# Patient Record
Sex: Female | Born: 1975 | Race: White | Hispanic: No | Marital: Single | Smoking: Current every day smoker
Health system: Southern US, Community
[De-identification: ages and names within clinical notes are randomized; demographics above are authoritative.]

## PROBLEM LIST (undated history)

## (undated) DIAGNOSIS — N809 Endometriosis, unspecified: Secondary | ICD-10-CM

## (undated) DIAGNOSIS — R0600 Dyspnea, unspecified: Secondary | ICD-10-CM

## (undated) DIAGNOSIS — J45909 Unspecified asthma, uncomplicated: Secondary | ICD-10-CM

## (undated) DIAGNOSIS — R569 Unspecified convulsions: Secondary | ICD-10-CM

## (undated) DIAGNOSIS — F419 Anxiety disorder, unspecified: Secondary | ICD-10-CM

## (undated) DIAGNOSIS — F32A Depression, unspecified: Secondary | ICD-10-CM

## (undated) DIAGNOSIS — Z8489 Family history of other specified conditions: Secondary | ICD-10-CM

## (undated) DIAGNOSIS — F329 Major depressive disorder, single episode, unspecified: Secondary | ICD-10-CM

## (undated) HISTORY — PX: TUBAL LIGATION: SHX77

## (undated) HISTORY — DX: Endometriosis, unspecified: N80.9

---

## 2014-10-25 DIAGNOSIS — R569 Unspecified convulsions: Secondary | ICD-10-CM

## 2014-10-25 HISTORY — DX: Unspecified convulsions: R56.9

## 2017-03-23 ENCOUNTER — Emergency Department: Payer: Medicaid Other

## 2017-03-23 ENCOUNTER — Emergency Department
Admission: EM | Admit: 2017-03-23 | Discharge: 2017-03-24 | Disposition: A | Discharge status: Home / Self Care | Payer: Medicaid Other | Attending: Emergency Medicine | Admitting: Emergency Medicine

## 2017-03-23 DIAGNOSIS — J45909 Unspecified asthma, uncomplicated: Secondary | ICD-10-CM | POA: Diagnosis not present

## 2017-03-23 DIAGNOSIS — N6452 Nipple discharge: Secondary | ICD-10-CM | POA: Insufficient documentation

## 2017-03-23 DIAGNOSIS — F17200 Nicotine dependence, unspecified, uncomplicated: Secondary | ICD-10-CM | POA: Diagnosis not present

## 2017-03-23 HISTORY — DX: Unspecified asthma, uncomplicated: J45.909

## 2017-03-23 LAB — COMPREHENSIVE METABOLIC PANEL
ALT: 15 U/L (ref 14–54)
AST: 17 U/L (ref 15–41)
Albumin: 4.3 g/dL (ref 3.5–5.0)
Alkaline Phosphatase: 53 U/L (ref 38–126)
Anion gap: 7 (ref 5–15)
BUN: 9 mg/dL (ref 6–20)
CO2: 25 mmol/L (ref 22–32)
Calcium: 8.8 mg/dL — ABNORMAL LOW (ref 8.9–10.3)
Chloride: 107 mmol/L (ref 101–111)
Creatinine, Ser: 0.75 mg/dL (ref 0.44–1.00)
GFR calc Af Amer: >60 mL/min (ref >60)
GFR calc non Af Amer: >60 mL/min (ref >60)
Glucose, Bld: 88 mg/dL (ref 65–99)
Potassium: 3.5 mmol/L (ref 3.5–5.1)
Sodium: 139 mmol/L (ref 135–145)
Total Bilirubin: 0.5 mg/dL (ref 0.3–1.2)
Total Protein: 7.6 g/dL (ref 6.5–8.1)

## 2017-03-23 LAB — CBC WITH DIFFERENTIAL/PLATELET
Basophils Absolute: 0 10*3/uL (ref 0–0.1)
Basophils Relative: 0 %
Eosinophils Absolute: 0.3 10*3/uL (ref 0–0.7)
Eosinophils Relative: 3 %
HCT: 41 % (ref 35.0–47.0)
Hemoglobin: 14.2 g/dL (ref 12.0–16.0)
Lymphocytes Relative: 32 %
Lymphs Abs: 3.7 10*3/uL — ABNORMAL HIGH (ref 1.0–3.6)
MCH: 33.4 pg (ref 26.0–34.0)
MCHC: 34.6 g/dL (ref 32.0–36.0)
MCV: 96.6 fL (ref 80.0–100.0)
Monocytes Absolute: 0.6 10*3/uL (ref 0.2–0.9)
Monocytes Relative: 5 %
Neutro Abs: 7.2 10*3/uL — ABNORMAL HIGH (ref 1.4–6.5)
Neutrophils Relative %: 60 %
Platelets: 239 10*3/uL (ref 150–440)
RBC: 4.25 MIL/uL (ref 3.80–5.20)
RDW: 13.4 % (ref 11.5–14.5)
WBC: 11.9 10*3/uL — ABNORMAL HIGH (ref 3.6–11.0)

## 2017-03-23 NOTE — ED Notes (Signed)
Pt states she has white discharge coming out of left nipple, states it feels like she is being stabbed with a needle. States it happened a few months ago and resolved.

## 2017-03-23 NOTE — ED Triage Notes (Signed)
White discharge from left nipple. Pt hx of this X 2 months ago but it cleared on its own.

## 2017-03-24 ENCOUNTER — Emergency Department: Payer: Medicaid Other

## 2017-03-24 ENCOUNTER — Encounter: Payer: Self-pay | Admitting: Emergency Medicine

## 2017-03-24 LAB — POCT PREGNANCY, URINE: PREG TEST UR: NEGATIVE

## 2017-03-24 MED ORDER — GADOBENATE DIMEGLUMINE 529 MG/ML IV SOLN
15.0000 mL | Freq: Once | INTRAVENOUS | Status: AC | PRN
  Administered 2017-03-24: 15 mL via INTRAVENOUS

## 2017-03-24 MED ORDER — NICOTINE 21 MG/24HR TD PT24
21.0000 mg | MEDICATED_PATCH | Freq: Once | TRANSDERMAL | Status: DC
  Administered 2017-03-24: 21 mg via TRANSDERMAL
  Filled 2017-03-24: qty 1

## 2017-03-24 MED ORDER — ALBUTEROL SULFATE HFA 108 (90 BASE) MCG/ACT IN AERS: 2.0000 | INHALATION_SPRAY | RESPIRATORY_TRACT | 0 refills | Status: DC | PRN

## 2017-03-24 NOTE — ED Notes (Signed)
Report from rebecca, rn.  

## 2017-03-24 NOTE — ED Provider Notes (Signed)
[facility] Emergency Department Provider Note  ____________________________________________  Time seen: Approximately 4:32 PM  I have reviewed the triage vital signs and the nursing notes.   HISTORY  Chief Complaint Breast Discharge    HPI Melinda Olsen is a 41 y.o. female presenting to the emergency department with white, thick left nipple discharge that has occurred intermittently for the past 2 months. Patient has also had left nipple pain. Patient has noticed no erythema, palpable masses or dimpling of the skin of the left breast. She has been afebrile. No fever or chills. No personal history of breast abscesses. No nipple rings. Patient takes no medications chronically. Patient denies possibility of pregnancy. Patient denies personal history of malignancy. No bony pain. No weight loss. No family history of breast cancer. She denies associated changes in vision, nausea, vomiting or abdominal pain. Patient has not sought care with primary care before to address chief complaint.  Patient is a daily smoker. No alleviating measures have been attempted.   Past Medical History:  Diagnosis Date  . Asthma     There are no active problems to display for this patient.   History reviewed. No pertinent surgical history.  Prior to Admission medications   Medication Sig Start Date End Date Taking? Authorizing Provider  albuterol (PROVENTIL HFA;VENTOLIN HFA) 108 (90 Base) MCG/ACT inhaler Inhale 2 puffs into the lungs every 4 (four) hours as needed for wheezing or shortness of breath. 03/24/17   Irean HongSung, Jade J, MD    Allergies Patient has no known allergies.  No family history on file.  Social History Social History  Substance Use Topics  . Smoking status: Current Every Day Smoker  . Smokeless tobacco: Not on file  . Alcohol use Yes     Review of Systems  Constitutional: No fever/chills Eyes: No visual changes. No discharge ENT: No upper respiratory  complaints. Cardiovascular: no chest pain. Respiratory: no cough. No SOB. Gastrointestinal: No abdominal pain.  No nausea, no vomiting.  No diarrhea.  No constipation. Genitourinary: Negative for dysuria. No hematuria Musculoskeletal: Negative for musculoskeletal pain. Skin: Patient has left nipple discharge Neurological: Negative for headaches, focal weakness or numbness.   ____________________________________________   PHYSICAL EXAM:  VITAL SIGNS: ED Triage Vitals  Enc Vitals Group     BP 03/23/17 2101 135/88     Pulse Rate 03/23/17 2101 (!) 106     Resp 03/23/17 2101 20     Temp 03/23/17 2101 99.3 F (37.4 C)     Temp Source 03/23/17 2101 Oral     SpO2 03/23/17 2101 99 %     Weight 03/23/17 2102 145 lb (65.8 kg)     Height 03/23/17 2102 5\' 7"  (1.702 m)     Head Circumference --      Peak Flow --      Pain Score 03/23/17 2101 9     Pain Loc --      Pain Edu? --      Excl. in GC? --      Constitutional: Alert and oriented. Well appearing and in no acute distress. Eyes: Conjunctivae are normal. PERRL. EOMI. Head: Atraumatic. Cardiovascular: Normal rate, regular rhythm. Normal S1 and S2.  Good peripheral circulation. Respiratory: Normal respiratory effort without tachypnea or retractions. Lungs CTAB. Good air entry to the bases with no decreased or absent breath sounds. Gastrointestinal: Bowel sounds 4 quadrants. Soft and nontender to palpation. No guarding or rigidity. No palpable masses. No distention. No CVA tenderness. Musculoskeletal: Full range  of motion to all extremities. No gross deformities appreciated. Neurologic:  Normal speech and language. No gross focal neurologic deficits are appreciated. Cranial nerves: 2-10 normal as tested. Cerebellar: Finger-nose-finger WNL, heel to shin WNL. Sensorimotor: No sensory loss or abnormal reflexes. Vision: No visual field deficts noted to confrontation.  Speech: No dysarthria or expressive aphasia.  Skin: Breast exam:  Left breast: No palpable masses. No dimpling of the skin. Patient is able to self express thick left nipple discharge.  Psychiatric: Mood and affect are normal. Speech and behavior are normal. Patient exhibits appropriate insight and judgement.   ____________________________________________   LABS (all labs ordered are listed, but only abnormal results are displayed)  Labs Reviewed  CBC WITH DIFFERENTIAL/PLATELET - Abnormal; Notable for the following:       Result Value   WBC 11.9 (*)    Neutro Abs 7.2 (*)    Lymphs Abs 3.7 (*)    All other components within normal limits  COMPREHENSIVE METABOLIC PANEL - Abnormal; Notable for the following:    Calcium 8.8 (*)    All other components within normal limits  PROLACTIN  POC URINE PREG, ED  POCT PREGNANCY, URINE   ____________________________________________  EKG   ____________________________________________  RADIOLOGYI, Orvil Feil, personally viewed and evaluated these images (plain radiographs) as part of my medical decision making, as well as reviewing the written report by the radiologist.  Mr Laqueta Jean Wo Contrast  Result Date: 03/24/2017 CLINICAL DATA:  Nipple discharge for 2 months. EXAM: MRI HEAD WITHOUT AND WITH CONTRAST TECHNIQUE: Multiplanar, multiecho pulse sequences of the brain and surrounding structures were obtained without and with intravenous contrast. CONTRAST:  15mL MULTIHANCE GADOBENATE DIMEGLUMINE 529 MG/ML IV SOLN COMPARISON:  None. FINDINGS: SELLA: No abnormal sellar expansion. Normal posterior pituitary bright spot. No early foci of suspicious hypo enhancement. Pituitary stalk is midline with normal enhancement. Symmetric appearance of the cavernous sinuses and cavernous sinus flow voids. Normal appearance of the overlying optic chiasm. INTRACRANIAL CONTENTS: No reduced diffusion to suggest acute ischemia or hypercellular tumor. The ventricles and sulci are normal for patient's age. No suspicious parenchymal  signal, mass lesions, mass effect. No abnormal intraparenchymal or extra-axial enhancement. No abnormal extra-axial fluid collections. VASCULAR: Normal major intracranial vascular flow voids present at skull base. ORBITS: The ocular globes and orbital contents are non-suspicious. SINUSES: Small LEFT maxillary mucosal retention cysts, trace paranasal sinus mucosal thickening. Mastoid air cells are well aerated. SKULL/SOFT TISSUES: No suspicious calvarial bone marrow signal. Craniocervical junction maintained. IMPRESSION: Normal MRI of the brain and sella with and without contrast. Acute findings discussed with and reconfirmed by Dr.JADE SUNG on 03/24/2017 at 3:36 am. Electronically Signed   By: Awilda Metro M.D.   On: 03/24/2017 03:37   US Breast Ltd Uni Left Inc Axilla  Result Date: 03/24/2017 CLINICAL DATA:  Discharge from the left nipple, acute onset. Initial encounter. EXAM: ULTRASOUND OF THE LEFT BREAST COMPARISON:  None. FINDINGS: On physical exam, no focal abnormality is seen. Targeted ultrasound is performed, showing no focal abnormality underlying the left nipple. There is no evidence of abscess or mass. IMPRESSION: Unremarkable limited left breast ultrasound. Left areola is unremarkable in appearance. No evidence of abscess or mass. BI-RADS CATEGORY  1 Electronically Signed   By: Roanna Raider M.D.   On: 03/24/2017 00:59    ____________________________________________    PROCEDURES  Procedure(s) performed:    Procedures    Medications  gadobenate dimeglumine (MULTIHANCE) injection 15 mL (15 mLs Intravenous Contrast Given 03/24/17  1610)     ____________________________________________   INITIAL IMPRESSION / ASSESSMENT AND PLAN / ED COURSE  Pertinent labs & imaging results that were available during my care of the patient were reviewed by me and considered in my medical decision making (see chart for details).  Review of the [place] CSRS was performed in accordance of the [facility]  prior to dispensing any controlled drugs.     Assessment and plan: Left Nipple Discharge  Patient presents the emergency department with left nipple discharge. On physical exam, there was no dimpling of the skin overlying the left breast. No erythema or palpable masses. Patient was able to self express white nipple discharge that was purulent in nature. There was concern for left breast abscess. I discussed patient's case with Dr. Corliss Parish and we agreed that left breast ultrasound was warranted. Patient's care was transitioned to Dr. Chiquita Loth as results were being awaited from left breast ultrasound. If results of left breast ultrasound were noncontributory for diagnosis, an MRI of the brain was planned. Vital signs were reassuring prior to transitioned to main side. Dr. Dolores Frame assumed care. All patient questions were answered.    ____________________________________________  FINAL CLINICAL IMPRESSION(S) / ED DIAGNOSES  Final diagnoses:  Discharge from left nipple  Nipple discharge in female      NEW MEDICATIONS STARTED DURING THIS VISIT:  There are no discharge medications for this patient.       This chart was dictated using voice recognition software/Dragon. Despite best efforts to proofread, errors can occur which can change the meaning. Any change was purely unintentional.    Gasper Lloyd 03/24/17 1645    Loleta Rose, MD 03/24/17 Bettye Boeck    Loleta Rose, MD 03/24/17 [phone]

## 2017-03-24 NOTE — ED Provider Notes (Signed)
-----------------------------------------   1:57 AM on 03/24/2017 -----------------------------------------  Updated patient of ultrasound results and plan for MRI of the brain to evaluate for pituitary tumor. Patient wanting to go outside to smoke a cigarette. Nicotine patch offered.  ----------------------------------------- 3:57 AM on 03/24/2017 -----------------------------------------  MRI brain discussed with Dr. Karie KirksBloomer: Normal MRI of the brain and sella with and without contrast.  Updated patient of negative MRI results. She has received quite a thorough workup in the emergency department with basic lab work, prolactin (which is a send out test), ultrasound of the breast as well as MRI of the brain to rule out pituitary tumor. 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 Strict return precautions given. Patient verbalizes understanding and agrees with plan of care. Requests refill on albuterol prescription.   Irean HongSung, Jade J, MD 03/24/17 [id number]

## 2017-03-24 NOTE — ED Notes (Signed)
Patient transported to MRI 

## 2017-03-24 NOTE — Discharge Instructions (Signed)
Return to the ER for worsening symptoms, fever, redness or swelling or other concerns.

## 2017-03-24 NOTE — ED Notes (Signed)
Pt has removed jewelry and placed in cup.

## 2017-03-24 NOTE — ED Notes (Signed)
Dr. Dolores FrameSung in to updated pt and discuss plan of care. Pt states she would like to go out and smoke a cigarette. Nicotine patch offered by md. Pt  Consents to nicotine patch.  Po fluids offered with md consent.

## 2017-03-25 LAB — PROLACTIN: Prolactin: 5.2 ng/mL (ref 4.8–23.3)

## 2017-03-26 ENCOUNTER — Emergency Department
Admission: EM | Admit: 2017-03-26 | Discharge: 2017-03-26 | Disposition: A | Discharge status: Home / Self Care | Payer: Medicaid Other | Attending: Emergency Medicine | Admitting: Emergency Medicine

## 2017-03-26 ENCOUNTER — Encounter: Payer: Self-pay | Admitting: Emergency Medicine

## 2017-03-26 DIAGNOSIS — F17200 Nicotine dependence, unspecified, uncomplicated: Secondary | ICD-10-CM | POA: Insufficient documentation

## 2017-03-26 DIAGNOSIS — N6452 Nipple discharge: Secondary | ICD-10-CM | POA: Diagnosis not present

## 2017-03-26 DIAGNOSIS — J45909 Unspecified asthma, uncomplicated: Secondary | ICD-10-CM | POA: Diagnosis not present

## 2017-03-26 DIAGNOSIS — Z79899 Other long term (current) drug therapy: Secondary | ICD-10-CM | POA: Insufficient documentation

## 2017-03-26 LAB — CBC WITH DIFFERENTIAL/PLATELET
BASOS ABS: 0 10*3/uL (ref 0–0.1)
BASOS PCT: 0 %
EOS ABS: 0.2 10*3/uL (ref 0–0.7)
EOS PCT: 3 %
HCT: 39.5 % (ref 35.0–47.0)
HEMOGLOBIN: 13.5 g/dL (ref 12.0–16.0)
Lymphocytes Relative: 29 %
Lymphs Abs: 2.6 10*3/uL (ref 1.0–3.6)
MCH: 33.1 pg (ref 26.0–34.0)
MCHC: 34.2 g/dL (ref 32.0–36.0)
MCV: 96.9 fL (ref 80.0–100.0)
Monocytes Absolute: 0.6 10*3/uL (ref 0.2–0.9)
Monocytes Relative: 7 %
NEUTROS PCT: 61 %
Neutro Abs: 5.5 10*3/uL (ref 1.4–6.5)
PLATELETS: 248 10*3/uL (ref 150–440)
RBC: 4.07 MIL/uL (ref 3.80–5.20)
RDW: 13.4 % (ref 11.5–14.5)
WBC: 8.9 10*3/uL (ref 3.6–11.0)

## 2017-03-26 LAB — COMPREHENSIVE METABOLIC PANEL
ALK PHOS: 57 U/L (ref 38–126)
ALT: 15 U/L (ref 14–54)
AST: 18 U/L (ref 15–41)
Albumin: 4.2 g/dL (ref 3.5–5.0)
Anion gap: 8 (ref 5–15)
BUN: 14 mg/dL (ref 6–20)
CALCIUM: 8.9 mg/dL (ref 8.9–10.3)
CHLORIDE: 104 mmol/L (ref 101–111)
CO2: 26 mmol/L (ref 22–32)
CREATININE: 0.8 mg/dL (ref 0.44–1.00)
GFR calc non Af Amer: >60 mL/min (ref >60)
Glucose, Bld: 78 mg/dL (ref 65–99)
Potassium: 3.9 mmol/L (ref 3.5–5.1)
Sodium: 138 mmol/L (ref 135–145)
Total Bilirubin: 0.4 mg/dL (ref 0.3–1.2)
Total Protein: 7.5 g/dL (ref 6.5–8.1)

## 2017-03-26 MED ORDER — SULFAMETHOXAZOLE-TRIMETHOPRIM 800-160 MG PO TABS
1.0000 | ORAL_TABLET | Freq: Once | ORAL | Status: AC
  Administered 2017-03-26: 1 via ORAL
  Filled 2017-03-26: qty 1

## 2017-03-26 MED ORDER — METRONIDAZOLE 500 MG PO TABS: 500.0000 mg | ORAL_TABLET | Freq: Two times a day (BID) | ORAL | 0 refills | Status: DC

## 2017-03-26 MED ORDER — SULFAMETHOXAZOLE-TRIMETHOPRIM 800-160 MG PO TABS: 1.0000 | ORAL_TABLET | Freq: Two times a day (BID) | ORAL | 0 refills | Status: DC

## 2017-03-26 MED ORDER — METRONIDAZOLE 500 MG PO TABS
500.0000 mg | ORAL_TABLET | Freq: Once | ORAL | Status: AC
  Administered 2017-03-26: 500 mg via ORAL
  Filled 2017-03-26: qty 1

## 2017-03-26 NOTE — ED Notes (Addendum)
PT states seen here 5/30 with Lt breast d/c-US, labs, MRI reassuring. Pt states unable to get into Endocrinology as referred until Monday. Pt states this morning d/c from breast was bright blood smeared puss and decided to be seen emergently again d/t d/c paperwork general warnings.    Denies fever, or any other sx at this time.

## 2017-03-26 NOTE — ED Provider Notes (Signed)
[facility] Emergency Department Provider Note ____________________________________________   I have reviewed the triage vital signs and the triage nursing note.  HISTORY  Chief Complaint Breast Discharge   Historian Patient  HPI Melinda Olsen is a 41 y.o. female presents for reevaluation for left nipple drainage from her left breast. She states symptoms started on last Saturday, and she came to the emergency department on Thursday evening. I reviewed the chart history and ultrasound of the left breast was negative, and MRI the brain was reassuring.  Patient states that she is having increased amount of discharge that seems more puslike and is also slightly bloody. The nipple is also painful and feels sharp. No fever or nausea or sweats.  Pain is moderate.   Past Medical History:  Diagnosis Date  . Asthma     There are no active problems to display for this patient.   Past Surgical History:  Procedure Laterality Date  . CESAREAN SECTION      Prior to Admission medications   Medication Sig Start Date End Date Taking? Authorizing Provider  albuterol (PROVENTIL HFA;VENTOLIN HFA) 108 (90 Base) MCG/ACT inhaler Inhale 2 puffs into the lungs every 4 (four) hours as needed for wheezing or shortness of breath. 03/24/17  Yes Irean Hong, MD  ibuprofen (ADVIL,MOTRIN) 200 MG tablet Take 200 mg by mouth every 6 (six) hours as needed.   Yes [provider]  metroNIDAZOLE (FLAGYL) 500 MG tablet Take 1 tablet (500 mg total) by mouth 2 (two) times daily. 03/26/17   Governor Rooks, MD  sulfamethoxazole-trimethoprim (BACTRIM DS,SEPTRA DS) 800-160 MG tablet Take 1 tablet by mouth 2 (two) times daily. 03/26/17   Governor Rooks, MD    No Known Allergies  History reviewed. No pertinent family history.  Social History Social History  Substance Use Topics  . Smoking status: Current Every Day Smoker  . Smokeless tobacco: Never Used  . Alcohol use Yes     Review of Systems  Constitutional: Negative for fever. Eyes: Negative for visual changes. ENT: Negative for sore throat. Cardiovascular: Negative for chest pain. Respiratory: Negative for shortness of breath. Gastrointestinal: Negative for abdominal pain, vomiting and diarrhea. Genitourinary: Negative for dysuria. Musculoskeletal: Negative for back pain. Skin: Negative for rash. Neurological: Negative for headache.  ____________________________________________   PHYSICAL EXAM:  VITAL SIGNS: ED Triage Vitals  Enc Vitals Group     BP 03/26/17 1642 121/74     Pulse Rate 03/26/17 1642 98     Resp 03/26/17 1642 18     Temp 03/26/17 1642 98.3 F (36.8 C)     Temp Source 03/26/17 1642 Oral     SpO2 03/26/17 1642 98 %     Weight 03/26/17 1642 145 lb (65.8 kg)     Height 03/26/17 1642 5\' 7"  (1.702 m)     Head Circumference --      Peak Flow --      Pain Score 03/26/17 1641 9     Pain Loc --      Pain Edu? --      Excl. in GC? --      Constitutional: Alert and oriented. Well appearing and in no distress. HEENT   Head: Normocephalic and atraumatic.      Eyes: Conjunctivae are normal. Pupils equal and round.       Ears:         Nose: No congestion/rhinnorhea.   Mouth/Throat: Mucous membranes are moist.   Neck: No stridor. Cardiovascular/Chest: Left breast  normal visual inspection without redness, peau d'orange.  On palpation no lumps appreciated. No evidence of a skin cellulitis or fluctuance or induration. With fairly minimal press on the nipple small amount of purulent/bloody material comes out, about a drop at a time. She also reports that sore tender.  No axillary lymphadenopathy on the left. Respiratory: Normal respiratory effort without tachypnea nor retractions. Gastrointestinal: Nontender.  Genitourinary/rectal:Deferred Musculoskeletal: Normal appearance of the extremities. Neurologic:  Normal speech and language. No gross or focal neurologic deficits  are appreciated. Skin:  Skin is warm, dry and intact.   Psychiatric: Mood and affect are normal. Speech and behavior are normal. Patient exhibits appropriate insight and judgment.   ____________________________________________  LABS (pertinent positives/negatives)  Labs Reviewed  AEROBIC CULTURE (SUPERFICIAL SPECIMEN)  CBC WITH DIFFERENTIAL/PLATELET  COMPREHENSIVE METABOLIC PANEL    ____________________________________________    EKG I, Governor Rooksebecca Madissen Wyse, MD, the attending physician have personally viewed and interpreted all ECGs.  None ____________________________________________  RADIOLOGY All Xrays were viewed by me. Imaging interpreted by Radiologist.  None __________________________________________  PROCEDURES  Procedure(s) performed: None  Critical Care performed: None  ____________________________________________   ED COURSE / ASSESSMENT AND PLAN  Pertinent labs & imaging results that were available during my care of the patient were reviewed by me and considered in my medical decision making (see chart for details).    She is overall well-appearing and afebrile with stable vital signs. To visual inspection there is no obvious abscess or fluctuance or induration. She does not seem to be systemically ill. The material expressed from the nipple does look more purulent to me than it does milk. When she was seen on the 30th she did have a low-grade temperature and was a little tachycardic, today she's not. White blood cell count was elevated before and now it's not. In any case, I'm wondering at this point if there was some sort of small abscess that is now found its way draining through the nipple. And in a place her on Bactrim for MRSA coverage as well as Flagyl for anaerobic coverage given location subareolar area.    I don't see an indication for repeat imaging to this area at this point in time. She does not have a primary care doctor and I am referring her to  [facility]. I think she should probably be set up to have an mammogram and potentially a surgeon if she has any ongoing issues.  I am going to send a culture of the discharge from the nipple.    CONSULTATIONS:  None   Patient / Family / Caregiver informed of clinical course, medical decision-making process, and agree with plan.   I discussed return precautions, follow-up instructions, and discharge instructions with patient and/or family.  Discharge Instructions : You were evaluated for nipple drainage and as we discussed, I am suspicious of pus and am going to place you on 2 antibiotics to cover possible infections.  You do need close follow-up with a primary care doctor and her referred to the [facility]. As we discussed, he will likely need to be scheduled for a mammogram and possible referral to breast surgeon for further evaluation. Next the next line please go ahead and keep your endocrinology appointment.  Please return to the emergency room immediately for any worsening pain, breast swelling, skin redness, fever, dizziness, nausea, or any other symptoms concerning to you.  ___________________________________________   FINAL CLINICAL IMPRESSION(S) / ED DIAGNOSES   Final diagnoses:  Purulent discharge from nipple  Note: This dictation was prepared with Dragon dictation. Any transcriptional errors that result from this process are unintentional    Governor Rooks, MD 03/26/17 2028

## 2017-03-26 NOTE — Discharge Instructions (Signed)
You were evaluated for nipple drainage and as we discussed, I am suspicious of pus and am going to place you on 2 antibiotics to cover possible infections.  You do need close follow-up with a primary care doctor and her referred to the [facility]. As we discussed, he will likely need to be scheduled for a mammogram and possible referral to breast surgeon for further evaluation. Next the next line please go ahead and keep your endocrinology appointment.  Please return to the emergency room immediately for any worsening pain, breast swelling, skin redness, fever, dizziness, nausea, or any other symptoms concerning to you.

## 2017-03-26 NOTE — ED Notes (Signed)
Pt. Verbalizes understanding of d/c instructions, prescriptions, and follow-up. VS stable.Pt. In NAD at time of d/c and denies further concerns regarding this visit. Pt. Stable at the time of departure from the unit, departing unit by the safest and most appropriate manner per that pt condition and limitations. Pt advised to return to the ED at any time for emergent concerns, or for new/worsening symptoms.   

## 2017-03-26 NOTE — ED Triage Notes (Signed)
Pt states was seen here on 5/30 for white discharge from L nipple, states was told to return for worsening of the discharge, pt states has now turned into "pus and bloody drainage" from L nipple and c/o pain that feels like her "nipple is being pierced".

## 2017-03-29 LAB — AEROBIC CULTURE W GRAM STAIN (SUPERFICIAL SPECIMEN)

## 2017-03-29 LAB — AEROBIC CULTURE  (SUPERFICIAL SPECIMEN): CULTURE: NORMAL

## 2017-04-11 ENCOUNTER — Ambulatory Visit: Payer: Self-pay | Attending: Oncology

## 2017-09-26 ENCOUNTER — Encounter: Payer: Self-pay | Admitting: Emergency Medicine

## 2017-09-26 ENCOUNTER — Emergency Department
Admission: EM | Admit: 2017-09-26 | Discharge: 2017-09-26 | Disposition: A | Discharge status: Home / Self Care | Payer: Medicaid Other | Attending: Emergency Medicine | Admitting: Emergency Medicine

## 2017-09-26 ENCOUNTER — Other Ambulatory Visit: Payer: Self-pay

## 2017-09-26 DIAGNOSIS — R112 Nausea with vomiting, unspecified: Secondary | ICD-10-CM | POA: Insufficient documentation

## 2017-09-26 DIAGNOSIS — N6452 Nipple discharge: Secondary | ICD-10-CM | POA: Diagnosis not present

## 2017-09-26 DIAGNOSIS — J45909 Unspecified asthma, uncomplicated: Secondary | ICD-10-CM | POA: Diagnosis not present

## 2017-09-26 DIAGNOSIS — F17200 Nicotine dependence, unspecified, uncomplicated: Secondary | ICD-10-CM | POA: Insufficient documentation

## 2017-09-26 DIAGNOSIS — R1010 Upper abdominal pain, unspecified: Secondary | ICD-10-CM | POA: Diagnosis present

## 2017-09-26 DIAGNOSIS — R197 Diarrhea, unspecified: Secondary | ICD-10-CM

## 2017-09-26 LAB — COMPREHENSIVE METABOLIC PANEL
ALT: 21 U/L (ref 14–54)
ANION GAP: 8 (ref 5–15)
AST: 24 U/L (ref 15–41)
Albumin: 4.3 g/dL (ref 3.5–5.0)
Alkaline Phosphatase: 72 U/L (ref 38–126)
BUN: 8 mg/dL (ref 6–20)
CO2: 25 mmol/L (ref 22–32)
Calcium: 9.1 mg/dL (ref 8.9–10.3)
Chloride: 105 mmol/L (ref 101–111)
Creatinine, Ser: 0.82 mg/dL (ref 0.44–1.00)
GFR calc Af Amer: >60 mL/min (ref >60)
GFR calc non Af Amer: >60 mL/min (ref >60)
GLUCOSE: 97 mg/dL (ref 65–99)
POTASSIUM: 3.3 mmol/L — AB (ref 3.5–5.1)
SODIUM: 138 mmol/L (ref 135–145)
Total Bilirubin: 0.4 mg/dL (ref 0.3–1.2)
Total Protein: 8.1 g/dL (ref 6.5–8.1)

## 2017-09-26 LAB — CBC
HEMATOCRIT: 43.3 % (ref 35.0–47.0)
HEMOGLOBIN: 14.6 g/dL (ref 12.0–16.0)
MCH: 32.3 pg (ref 26.0–34.0)
MCHC: 33.7 g/dL (ref 32.0–36.0)
MCV: 95.9 fL (ref 80.0–100.0)
Platelets: 293 10*3/uL (ref 150–440)
RBC: 4.52 MIL/uL (ref 3.80–5.20)
RDW: 13.4 % (ref 11.5–14.5)
WBC: 13.7 10*3/uL — ABNORMAL HIGH (ref 3.6–11.0)

## 2017-09-26 LAB — URINALYSIS, COMPLETE (UACMP) WITH MICROSCOPIC
BACTERIA UA: NONE SEEN
Bilirubin Urine: NEGATIVE
Glucose, UA: NEGATIVE mg/dL
Hgb urine dipstick: NEGATIVE
Ketones, ur: 5 mg/dL — AB
Leukocytes, UA: NEGATIVE
Nitrite: NEGATIVE
PROTEIN: 30 mg/dL — AB
Specific Gravity, Urine: 1.021 (ref 1.005–1.030)
pH: 5 (ref 5.0–8.0)

## 2017-09-26 LAB — LIPASE, BLOOD: LIPASE: 32 U/L (ref 11–51)

## 2017-09-26 LAB — POCT PREGNANCY, URINE: PREG TEST UR: NEGATIVE

## 2017-09-26 MED ORDER — GI COCKTAIL ~~LOC~~
30.0000 mL | Freq: Once | ORAL | Status: AC
  Administered 2017-09-26: 30 mL via ORAL
  Filled 2017-09-26: qty 30

## 2017-09-26 MED ORDER — ONDANSETRON HCL 4 MG PO TABS
4.0000 mg | ORAL_TABLET | Freq: Once | ORAL | Status: AC
  Administered 2017-09-26: 4 mg via ORAL
  Filled 2017-09-26 (×2): qty 1

## 2017-09-26 MED ORDER — ONDANSETRON HCL 4 MG PO TABS: 4.0000 mg | ORAL_TABLET | Freq: Every day | ORAL | 0 refills | Status: DC | PRN

## 2017-09-26 MED ORDER — DICYCLOMINE HCL 10 MG PO CAPS: 10.0000 mg | ORAL_CAPSULE | Freq: Once | ORAL | Status: DC

## 2017-09-26 MED ORDER — DICYCLOMINE HCL 20 MG PO TABS: 20.0000 mg | ORAL_TABLET | Freq: Three times a day (TID) | ORAL | 0 refills | Status: DC | PRN

## 2017-09-26 NOTE — ED Provider Notes (Addendum)
[facility] Emergency Department Provider Note  ____________________________________________   First MD Initiated Contact with Patient 09/26/17 1952     (approximate)  I have reviewed the triage vital signs and the nursing notes.   HISTORY  Chief Complaint Abdominal Pain   HPI Melinda Olsen is a 41 y.o. female with a history of asthma as well as hemorrhoids who is presenting to the emergency department upper abdominal pain which she describes as a cramping that is nonradiating over the past 24 hours.  She has associated nausea and 12 episodes of diarrhea with blood streaks in them.  Denies any recent antibiotics.  Denies any recent hospitalizations.  Denies any fever.  Says that her pain has been intermittent to the upper abdomen and cramping in quality.  Only minimal pain at this time.  Patient also reports left nipple discharge which is intermittent and which she has an appointment established with her primary care doctor on 18 December.   Past Medical History:  Diagnosis Date  . Asthma     There are no active problems to display for this patient.   Past Surgical History:  Procedure Laterality Date  . CESAREAN SECTION      Prior to Admission medications   Medication Sig Start Date End Date Taking? Authorizing Provider  albuterol (PROVENTIL HFA;VENTOLIN HFA) 108 (90 Base) MCG/ACT inhaler Inhale 2 puffs into the lungs every 4 (four) hours as needed for wheezing or shortness of breath. 03/24/17   Irean HongSung, Jade J, MD  ibuprofen (ADVIL,MOTRIN) 200 MG tablet Take 200 mg by mouth every 6 (six) hours as needed.    [provider]  metroNIDAZOLE (FLAGYL) 500 MG tablet Take 1 tablet (500 mg total) by mouth 2 (two) times daily. 03/26/17   Governor RooksLord, Rebecca, MD  sulfamethoxazole-trimethoprim (BACTRIM DS,SEPTRA DS) 800-160 MG tablet Take 1 tablet by mouth 2 (two) times daily. 03/26/17   Governor RooksLord, Rebecca, MD    Allergies Patient has no known allergies.  No  family history on file.  Social History Social History   Tobacco Use  . Smoking status: Current Every Day Smoker  . Smokeless tobacco: Never Used  Substance Use Topics  . Alcohol use: Yes  . Drug use: No    Review of Systems  Constitutional: No fever/chills Eyes: No visual changes. ENT: No sore throat. Cardiovascular: Denies chest pain. Respiratory: Denies shortness of breath. Gastrointestinal:  No constipation. Genitourinary: Negative for dysuria. Musculoskeletal: Negative for back pain. Skin: Negative for rash. Neurological: Negative for headaches, focal weakness or numbness.   ____________________________________________   PHYSICAL EXAM:  VITAL SIGNS: ED Triage Vitals  Enc Vitals Group     BP 09/26/17 1917 115/86     Pulse Rate 09/26/17 1917 (!) 119     Resp 09/26/17 1917 17     Temp 09/26/17 1917 97.6 F (36.4 C)     Temp Source 09/26/17 1917 Oral     SpO2 09/26/17 1917 98 %     Weight 09/26/17 1919 173 lb (78.5 kg)     Height 09/26/17 1919 5\' 7"  (1.702 m)     Head Circumference --      Peak Flow --      Pain Score 09/26/17 1924 9     Pain Loc --      Pain Edu? --      Excl. in GC? --     Constitutional: Alert and oriented. Well appearing and in no acute distress. Eyes: Conjunctivae are normal.  Head: Atraumatic.  Nose: No congestion/rhinnorhea. Mouth/Throat: Mucous membranes are moist.  Neck: No stridor.   Cardiovascular: Normal rate, regular rhythm. Grossly normal heart sounds.  Breast:   Patient demonstrates a small amount of discharge from left nipple that is expressed by her.  I did not examine with any palpation.  There does not appear to be any erythema or any thickened skin or any obvious masses. Respiratory: Normal respiratory effort.  No retractions. Lungs CTAB. Gastrointestinal: Soft with minimal epigastric tenderness and left upper quadrant tenderness.  No tenderness to palpation over the gallbladder/right upper quadrant.  However, there is  tenderness palpation over the right lower anterior ribs without any ecchymosis, deformity or crepitus.  There is no overlying rash.  Negative Murphy sign.  No lower abdominal tenderness over McBurney's point or across the lower abdomen.. No distention. No CVA tenderness. Musculoskeletal: No lower extremity tenderness nor edema.  No joint effusions. Neurologic:  Normal speech and language. No gross focal neurologic deficits are appreciated. Skin:  Skin is warm, dry and intact. No rash noted. Psychiatric: Mood and affect are normal. Speech and behavior are normal.  ____________________________________________   LABS (all labs ordered are listed, but only abnormal results are displayed)  Labs Reviewed  COMPREHENSIVE METABOLIC PANEL - Abnormal; Notable for the following components:      Result Value   Potassium 3.3 (*)    All other components within normal limits  CBC - Abnormal; Notable for the following components:   WBC 13.7 (*)    All other components within normal limits  URINALYSIS, COMPLETE (UACMP) WITH MICROSCOPIC - Abnormal; Notable for the following components:   Color, Urine YELLOW (*)    APPearance HAZY (*)    Ketones, ur 5 (*)    Protein, ur 30 (*)    Squamous Epithelial / LPF 6-30 (*)    All other components within normal limits  LIPASE, BLOOD  POCT PREGNANCY, URINE  POC URINE PREG, ED   ____________________________________________  EKG   ____________________________________________  RADIOLOGY   ____________________________________________   PROCEDURES  Procedure(s) performed:   Procedures  Critical Care performed:   ____________________________________________   INITIAL IMPRESSION / ASSESSMENT AND PLAN / ED COURSE  Pertinent labs & imaging results that were available during my care of the patient were reviewed by me and considered in my medical decision making (see chart for details).  Differential diagnosis includes, but is not limited to,  biliary disease (biliary colic, acute cholecystitis, cholangitis, choledocholithiasis, etc), intrathoracic causes for epigastric abdominal pain including ACS, gastritis, duodenitis, pancreatitis, small bowel or large bowel obstruction, abdominal aortic aneurysm, hernia, and gastritis.  As part of my medical decision making, I reviewed the following data within the electronic MEDICAL RECORD NUMBER Notes from prior ED visits  Patient with elevated white blood cell count but no lower abdominal pain and only minimal tenderness to the upper abdomen.  Minimal risk factors for C. difficile or other serious forms of diarrhea.  Patient will receive GI cocktail as well as Zofran.  Patient to be reassessed.    ----------------------------------------- 9:51 PM on 09/26/2017 -----------------------------------------  Patient at this time his pain is still present but she has not had any diarrhea while in the emergency department.  No vomiting and able to tolerate GI cocktail and small sips of water.  Still with tenderness over the rib without any respiratory distress.  Patient saying at this time that she "just wants to go home."  I offered further testing such as imaging which she is unwilling at  this time.  She says that she will return for any worsening or concerning symptoms and will follow-up at her note appointment on 18 December.  Patient's abdomen was also re-auscultated.  Diffusely nontender.  Patient also states that she has blood on her toilet tissue even when she is not moving her bowels.  Unlikely to be colitis.  ____________________________________________   FINAL CLINICAL IMPRESSION(S) / ED DIAGNOSES  Left-sided nipple discharge.  Nausea vomiting and diarrhea.    NEW MEDICATIONS STARTED DURING THIS VISIT:  This SmartLink is deprecated. Use AVSMEDLIST instead to display the medication list for a patient.   Note:  This document was prepared using Dragon voice recognition software and may  include unintentional dictation errors.     Myrna Blazer, MD 09/26/17 2152    Myrna Blazer, MD 09/26/17 2153

## 2017-09-26 NOTE — ED Triage Notes (Signed)
Patient to ER for left abd pain and epigastric pain. Also reports h/o hemorrhoids, has had blood streaked on stools last couple of days as well. Denies vomiting. +Nausea, approx 10-11 episodes of diarrhea.

## 2017-11-06 ENCOUNTER — Other Ambulatory Visit: Payer: Self-pay

## 2017-11-06 ENCOUNTER — Emergency Department
Admission: EM | Admit: 2017-11-06 | Discharge: 2017-11-06 | Disposition: A | Discharge status: Home / Self Care | Payer: Medicaid Other | Attending: Emergency Medicine | Admitting: Emergency Medicine

## 2017-11-06 DIAGNOSIS — N946 Dysmenorrhea, unspecified: Secondary | ICD-10-CM | POA: Insufficient documentation

## 2017-11-06 DIAGNOSIS — Z79899 Other long term (current) drug therapy: Secondary | ICD-10-CM | POA: Diagnosis not present

## 2017-11-06 DIAGNOSIS — J45909 Unspecified asthma, uncomplicated: Secondary | ICD-10-CM | POA: Insufficient documentation

## 2017-11-06 DIAGNOSIS — F17200 Nicotine dependence, unspecified, uncomplicated: Secondary | ICD-10-CM | POA: Diagnosis not present

## 2017-11-06 DIAGNOSIS — R102 Pelvic and perineal pain: Secondary | ICD-10-CM | POA: Diagnosis present

## 2017-11-06 LAB — CBC
HEMATOCRIT: 38.8 % (ref 35.0–47.0)
Hemoglobin: 13.1 g/dL (ref 12.0–16.0)
MCH: 32 pg (ref 26.0–34.0)
MCHC: 33.9 g/dL (ref 32.0–36.0)
MCV: 94.6 fL (ref 80.0–100.0)
Platelets: 300 10*3/uL (ref 150–440)
RBC: 4.1 MIL/uL (ref 3.80–5.20)
RDW: 13.3 % (ref 11.5–14.5)
WBC: 9.3 10*3/uL (ref 3.6–11.0)

## 2017-11-06 LAB — COMPREHENSIVE METABOLIC PANEL
ALBUMIN: 3.8 g/dL (ref 3.5–5.0)
ALT: 17 U/L (ref 14–54)
AST: 21 U/L (ref 15–41)
Alkaline Phosphatase: 66 U/L (ref 38–126)
Anion gap: 7 (ref 5–15)
BILIRUBIN TOTAL: 0.5 mg/dL (ref 0.3–1.2)
BUN: 8 mg/dL (ref 6–20)
CHLORIDE: 108 mmol/L (ref 101–111)
CO2: 23 mmol/L (ref 22–32)
Calcium: 8 mg/dL — ABNORMAL LOW (ref 8.9–10.3)
Creatinine, Ser: 0.7 mg/dL (ref 0.44–1.00)
GFR calc Af Amer: >60 mL/min (ref >60)
GFR calc non Af Amer: >60 mL/min (ref >60)
GLUCOSE: 94 mg/dL (ref 65–99)
POTASSIUM: 3.9 mmol/L (ref 3.5–5.1)
SODIUM: 138 mmol/L (ref 135–145)
TOTAL PROTEIN: 6.9 g/dL (ref 6.5–8.1)

## 2017-11-06 LAB — LIPASE, BLOOD: Lipase: 34 U/L (ref 11–51)

## 2017-11-06 NOTE — ED Notes (Signed)
Pt states that she "feels like my ovaries are going to fall out" pt states that she has been in  for 4 years and hasn't set up for a dr, pt states that she now has an appt for the 29th for a family care dr, pt states that approx  5 years ago [place] in [place] tried to diagnose her with endometriosis. Pt reports that she has pain regardless of her position

## 2017-11-06 NOTE — ED Provider Notes (Signed)
[facility] Emergency Department Provider Note  ____________________________________________  Time seen: Approximately 11:05 PM  I have reviewed the triage vital signs and the nursing notes.   HISTORY  Chief Complaint Abdominal Pain and Back Pain    HPI Melinda Olsen is a 42 y.o. female who complains of pelvic pain radiating to her back since the onset of her period yesterday. It's a history of endometriosis and painful periods, today she's had to change her tampon every 2-4 hours. Denies lightheadedness or chest pain shortness of breath fevers or chills. Has an appointment with primary care in about 2 weeks to establish care. No aggravating or alleviating factors to the pain, moderate intensity, cramping.     Past Medical History:  Diagnosis Date  . Asthma      There are no active problems to display for this patient.    Past Surgical History:  Procedure Laterality Date  . CESAREAN SECTION       Prior to Admission medications   Medication Sig Start Date End Date Taking? Authorizing Provider  albuterol (PROVENTIL HFA;VENTOLIN HFA) 108 (90 Base) MCG/ACT inhaler Inhale 2 puffs into the lungs every 4 (four) hours as needed for wheezing or shortness of breath. 03/24/17   Irean HongSung, Jade J, MD  dicyclomine (BENTYL) 20 MG tablet Take 1 tablet (20 mg total) by mouth 3 (three) times daily as needed for spasms. 09/26/17 09/26/18  Myrna BlazerSchaevitz, David Matthew, MD  ibuprofen (ADVIL,MOTRIN) 200 MG tablet Take 200 mg by mouth every 6 (six) hours as needed.    [provider]  metroNIDAZOLE (FLAGYL) 500 MG tablet Take 1 tablet (500 mg total) by mouth 2 (two) times daily. 03/26/17   Governor RooksLord, Rebecca, MD  ondansetron (ZOFRAN) 4 MG tablet Take 1 tablet (4 mg total) by mouth daily as needed. 09/26/17   Schaevitz, Myra Rudeavid Matthew, MD  sulfamethoxazole-trimethoprim (BACTRIM DS,SEPTRA DS) 800-160 MG tablet Take 1 tablet by mouth 2 (two) times daily. 03/26/17   Governor RooksLord, Rebecca, MD      Allergies Patient has no known allergies.   No family history on file.  Social History Social History   Tobacco Use  . Smoking status: Current Every Day Smoker  . Smokeless tobacco: Never Used  Substance Use Topics  . Alcohol use: Yes  . Drug use: No    Review of Systems  Constitutional:   No fever or chills.  ENT:   No sore throat. No rhinorrhea. Cardiovascular:   No chest pain or syncope. Respiratory:   No dyspnea or cough. Gastrointestinal:   Positive for abd pain without vomiting and diarrhea.  Musculoskeletal:   Negative for focal pain or swelling All other systems reviewed and are negative except as documented above in ROS and HPI.  ____________________________________________   PHYSICAL EXAM:  VITAL SIGNS: ED Triage Vitals  Enc Vitals Group     BP 11/06/17 2040 126/81     Pulse Rate 11/06/17 2040 87     Resp 11/06/17 2040 20     Temp 11/06/17 2040 98.4 F (36.9 C)     Temp Source 11/06/17 2040 Oral     SpO2 11/06/17 2040 98 %     Weight 11/06/17 2040 175 lb (79.4 kg)     Height 11/06/17 2040 5\' 7"  (1.702 m)     Head Circumference --      Peak Flow --      Pain Score 11/06/17 2039 9     Pain Loc --      Pain  Edu? --      Excl. in GC? --     Vital signs reviewed, nursing assessments reviewed.   Constitutional:   Alert and oriented. Well appearing and in no distress. Eyes:   No scleral icterus.  EOMI. No nystagmus. No conjunctival pallor. PERRL. ENT   Head:   Normocephalic and atraumatic.   Nose:   No congestion/rhinnorhea.    Mouth/Throat:   MMM, no pharyngeal erythema. No peritonsillar mass.    Neck:   No meningismus. Full ROM. Hematological/Lymphatic/Immunilogical:   No cervical lymphadenopathy. Cardiovascular:   RRR. Symmetric bilateral radial and DP pulses.  No murmurs.  Respiratory:   Normal respiratory effort without tachypnea/retractions. Breath sounds are clear and equal bilaterally. No  wheezes/rales/rhonchi. Gastrointestinal:   Soft with suprapubic tenderness. Uterus nonpalpable. Non distended. There is no CVA tenderness.  No rebound, rigidity, or guarding. Genitourinary:   deferred Musculoskeletal:   Normal range of motion in all extremities. No joint effusions.  No lower extremity tenderness.  No edema. Neurologic:   Normal speech and language.  Motor grossly intact. No gross focal neurologic deficits are appreciated.  Skin:    Skin is warm, dry and intact. No rash noted.  No petechiae, purpura, or bullae.  ____________________________________________    LABS (pertinent positives/negatives) (all labs ordered are listed, but only abnormal results are displayed) Labs Reviewed  COMPREHENSIVE METABOLIC PANEL - Abnormal; Notable for the following components:      Result Value   Calcium 8.0 (*)    All other components within normal limits  LIPASE, BLOOD  CBC  URINALYSIS, COMPLETE (UACMP) WITH MICROSCOPIC  POC URINE PREG, ED   ____________________________________________   EKG    ____________________________________________    RADIOLOGY  No results found.  ____________________________________________   PROCEDURES Procedures  ____________________________________________    CLINICAL IMPRESSION / ASSESSMENT AND PLAN / ED COURSE  Pertinent labs & imaging results that were available during my care of the patient were reviewed by me and considered in my medical decision making (see chart for details).     Clinical Course as of Nov 07 2303  Wynelle Link Nov 06, 2017  2135 Labs unremarkable  [PS]  2303 Patient refuses urinalysis. She requests be discharged immediately because she wants to have a cigarette and she agrees that her dysmenorrhea symptoms are likely persistent no matter what she does until her period passes. She has follow-up with primary care later this month. No other acute needs her complaints. Vital signs are normal, well-appearing and suitable  for outpatient follow-up.  [PS]    Clinical Course User Index [PS] Sharman Cheek, MD     ____________________________________________   FINAL CLINICAL IMPRESSION(S) / ED DIAGNOSES    Final diagnoses:  Dysmenorrhea       Portions of this note were generated with dragon dictation software. Dictation errors may occur despite best attempts at proofreading.    Sharman Cheek, MD 11/06/17 2314

## 2017-11-06 NOTE — ED Triage Notes (Signed)
Patient reports started menstrual today, but having lower abdominal pain ("I feel like my ovaries are gonna fall out") and back pain.  Reports does not feel like usual menstrual symptoms.

## 2017-11-09 ENCOUNTER — Encounter: Payer: Self-pay | Admitting: Obstetrics & Gynecology

## 2017-11-09 ENCOUNTER — Ambulatory Visit (INDEPENDENT_AMBULATORY_CARE_PROVIDER_SITE_OTHER): Payer: Medicaid Other | Admitting: Obstetrics & Gynecology

## 2017-11-09 ENCOUNTER — Encounter: Payer: Self-pay | Admitting: Obstetrics and Gynecology

## 2017-11-09 VITALS — BP 120/80 | HR 102 | Ht 67.0 in | Wt 172.0 lb

## 2017-11-09 DIAGNOSIS — N643 Galactorrhea not associated with childbirth: Secondary | ICD-10-CM | POA: Diagnosis not present

## 2017-11-09 DIAGNOSIS — N946 Dysmenorrhea, unspecified: Secondary | ICD-10-CM | POA: Diagnosis not present

## 2017-11-09 DIAGNOSIS — R102 Pelvic and perineal pain: Secondary | ICD-10-CM

## 2017-11-09 DIAGNOSIS — Z124 Encounter for screening for malignant neoplasm of cervix: Secondary | ICD-10-CM

## 2017-11-09 DIAGNOSIS — G8929 Other chronic pain: Secondary | ICD-10-CM | POA: Diagnosis not present

## 2017-11-09 NOTE — Addendum Note (Signed)
Addended by: Nadara MustardHARRIS, Caden Fukushima P on: 11/09/2017 10:11 AM   Modules accepted: Orders, Level of Service

## 2017-11-09 NOTE — Progress Notes (Addendum)
Gynecology Pelvic Pain Evaluation   Chief Complaint:    left breast discharge and dysmenorrhea   History of Present Illness:   Patient is a 42 y.o. [id number] who LMP was Patient's last menstrual period was 11/06/2017., presents today for a problem visit.  She complains of pain and left nipple discharge..   Her pain is localized to the deep pelvis area, described as sharp and stabbing, began several years ago and its severity is described as severe. The pain radiates to the  Non-radiating. She has these associated symptoms which include headache and nausea. Patient has these modifiers which include IBF (min relief) that make it better and activity that make it worse.  Context includes: before and during periods.  No previous studies or procedures.  Prior CS x2, BTL.  No desire for fertility.  Tried OCP and Depo in past w no help.  Peirods are every 24 days.  Also c/o left breast discharge clear to white intermittant w occas blood tinge noted; no mass or tenderness, no skin changes.  PMHx: She  has a past medical history of Asthma and Endometriosis. Also,  has a past surgical history that includes Cesarean section., family history includes Breast cancer in her mother; Cancer in her maternal grandfather, paternal aunt, and paternal aunt.,  reports that she has been smoking.  she has never used smokeless tobacco. She reports that she drinks alcohol. She reports that she does not use drugs.  She has a current medication list which includes the following prescription(s): albuterol, dicyclomine, ibuprofen, metronidazole, ondansetron, and sulfamethoxazole-trimethoprim. Also, has No Known Allergies.  Review of Systems  Constitutional: Negative for chills, fever and malaise/fatigue.  HENT: Negative for congestion, sinus pain and sore throat.   Eyes: Negative for blurred vision and pain.  Respiratory: Negative for cough and wheezing.   Cardiovascular: Negative for chest pain and leg swelling.    Gastrointestinal: Negative for abdominal pain, constipation, diarrhea, heartburn, nausea and vomiting.  Genitourinary: Negative for dysuria, frequency, hematuria and urgency.  Musculoskeletal: Negative for back pain, joint pain, myalgias and neck pain.  Skin: Negative for itching and rash.  Neurological: Negative for dizziness, tremors and weakness.  Endo/Heme/Allergies: Does not bruise/bleed easily.  Psychiatric/Behavioral: Negative for depression. The patient is not nervous/anxious and does not have insomnia.    Objective: BP 120/80   Pulse (!) 102   Ht 5\' 7"  (1.702 m)   Wt 172 lb (78 kg)   LMP 11/06/2017   BMI 26.94 kg/m  Physical Exam  Constitutional: She is oriented to person, place, and time. She appears well-developed and well-nourished. No distress.  Genitourinary: Rectum normal and vagina normal. Pelvic exam was performed with patient supine. There is no rash or lesion on the right labia. There is no rash or lesion on the left labia. Vagina exhibits no lesion. No bleeding in the vagina. Right adnexum does not display mass and does not display tenderness. Left adnexum does not display mass and does not display tenderness. Cervix does not exhibit motion tenderness, lesion, friability or polyp.   Uterus is tender and midaxial. Uterus is not enlarged, exhibiting a mass or mobile.  HENT:  Head: Normocephalic and atraumatic. Head is without laceration.  Right Ear: Hearing normal.  Left Ear: Hearing normal.  Nose: No epistaxis.  No foreign bodies.  Mouth/Throat: Uvula is midline, oropharynx is clear and moist and mucous membranes are normal.  Eyes: Pupils are equal, round, and reactive to light.  Neck: Normal range of motion. Neck supple. No  thyromegaly present.  Cardiovascular: Normal rate and regular rhythm. Exam reveals no gallop and no friction rub.  No murmur heard. Pulmonary/Chest: Effort normal and breath sounds normal. No respiratory distress. She has no wheezes. Right  breast exhibits no mass, no skin change and no tenderness. Left breast exhibits no mass, no skin change and no tenderness.  No mass.  No discharge expressed.  Abdominal: Soft. Bowel sounds are normal. She exhibits no distension. There is no tenderness. There is no rebound.  Musculoskeletal: Normal range of motion.  Neurological: She is alert and oriented to person, place, and time. No cranial nerve deficit.  Skin: Skin is warm and dry.  Psychiatric: She has a normal mood and affect. Judgment normal.  Vitals reviewed.  Female chaperone present for pelvic portion of the physical exam  Assessment: 42 y.o. [id number] with DYSMENORRHEA and GALACTORRHEA.  1. Dysmenorrhea - US PELVIS TRANSVANGINAL NON-OB (TV ONLY); Future - Endometriosis and other etiology discussed. - Failed prior medical/hormonal therapy - Consideration for ablation or hysterectomy discussed  2. Galactorrhea - Prolactin - FSH/LH - TSH - MM DIAG BREAST TOMO UNI LEFT; Future  3. Chronic pelvic pain in female - As above - US PELVIS TRANSVANGINAL NON-OB (TV ONLY); Future  4. Screening for cervical cancer - Pap IG (Image Guided)  Annamarie Major, MD, [facility], [facility] 11/09/2017  10:02 AM

## 2017-11-09 NOTE — Patient Instructions (Signed)
Dysmenorrhea °Menstrual cramps (dysmenorrhea) are caused by the muscles of the uterus tightening (contracting) during a menstrual period. For some women, this discomfort is merely bothersome. For others, dysmenorrhea can be severe enough to interfere with everyday activities for a few days each month. °Primary dysmenorrhea is menstrual cramps that last a couple of days when you start having menstrual periods or soon after. This often begins after a teenager starts having her period. As a woman gets older or has a baby, the cramps will usually lessen or disappear. Secondary dysmenorrhea begins later in life, lasts longer, and the pain may be stronger than primary dysmenorrhea. The pain may start before the period and last a few days after the period. °What are the causes? °Dysmenorrhea is usually caused by an underlying problem, such as: °· The tissue lining the uterus grows outside of the uterus in other areas of the body (endometriosis). °· The endometrial tissue, which normally lines the uterus, is found in or grows into the muscular walls of the uterus (adenomyosis). °· The pelvic blood vessels are engorged with blood just before the menstrual period (pelvic congestive syndrome). °· Overgrowth of cells (polyps) in the lining of the uterus or cervix. °· Falling down of the uterus (prolapse) because of loose or stretched ligaments. °· Depression. °· Bladder problems, infection, or inflammation. °· Problems with the intestine, a tumor, or irritable bowel syndrome. °· Cancer of the female organs or bladder. °· A severely tipped uterus. °· A very tight opening or closed cervix. °· Noncancerous tumors of the uterus (fibroids). °· Pelvic inflammatory disease (PID). °· Pelvic scarring (adhesions) from a previous surgery. °· Ovarian cyst. °· An intrauterine device (IUD) used for birth control. °What increases the risk? °You may be at greater risk of dysmenorrhea if: °· You are younger than age 30. °· You started puberty  early. °· You have irregular or heavy bleeding. °· You have never given birth. °· You have a family history of this problem. °· You are a smoker. °What are the signs or symptoms? °· Cramping or throbbing pain in your lower abdomen. °· Headaches. °· Lower back pain. °· Nausea or vomiting. °· Diarrhea. °· Sweating or dizziness. °· Loose stools. °How is this diagnosed? °A diagnosis is based on your history, symptoms, physical exam, diagnostic tests, or procedures. Diagnostic tests or procedures may include: °· Blood tests. °· Ultrasonography. °· An examination of the lining of the uterus (dilation and curettage, D&C). °· An examination inside your abdomen or pelvis with a scope (laparoscopy). °· X-rays. °· CT scan. °· MRI. °· An examination inside the bladder with a scope (cystoscopy). °· An examination inside the intestine or stomach with a scope (colonoscopy, gastroscopy). °How is this treated? °Treatment depends on the cause of the dysmenorrhea. Treatment may include: °· Pain medicine prescribed by your health care provider. °· Birth control pills or an IUD with progesterone hormone in it. °· Hormone replacement therapy. °· Nonsteroidal anti-inflammatory drugs (NSAIDs). These may help stop the production of prostaglandins. °· Surgery to remove adhesions, endometriosis, ovarian cyst, or fibroids. °· Removal of the uterus (hysterectomy). °· Progesterone shots to stop the menstrual period. °· Cutting the nerves on the sacrum that go to the female organs (presacral neurectomy). °· Electric current to the sacral nerves (sacral nerve stimulation). °· Antidepressant medicine. °· Psychiatric therapy, counseling, or group therapy. °· Exercise and physical therapy. °· Meditation and yoga therapy. °· Acupuncture. °Follow these instructions at home: °· Only take over-the-counter or prescription medicines as directed   by your health care provider. °· Place a heating pad or hot water bottle on your lower back or abdomen. Do not  sleep with the heating pad. °· Use aerobic exercises, walking, swimming, biking, and other exercises to help lessen the cramping. °· Massage to the lower back or abdomen may help. °· Stop smoking. °· Avoid alcohol and caffeine. °Contact a health care provider if: °· Your pain does not get better with medicine. °· You have pain with sexual intercourse. °· Your pain increases and is not controlled with medicines. °· You have abnormal vaginal bleeding with your period. °· You develop nausea or vomiting with your period that is not controlled with medicine. °Get help right away if: °You pass out. °This information is not intended to replace advice given to you by your health care provider. Make sure you discuss any questions you have with your health care provider. °Document Released: 10/11/2005 Document Revised: 03/18/2016 Document Reviewed: 03/29/2013 °Elsevier Interactive Patient Education © 2017 [employer] ° °

## 2017-11-10 ENCOUNTER — Other Ambulatory Visit: Payer: Self-pay | Admitting: Obstetrics & Gynecology

## 2017-11-10 DIAGNOSIS — N643 Galactorrhea not associated with childbirth: Secondary | ICD-10-CM

## 2017-11-10 LAB — PAP IG (IMAGE GUIDED): PAP SMEAR COMMENT: 0

## 2017-11-10 LAB — FSH/LH
FSH: 3.8 m[IU]/mL
LH: 3.9 m[IU]/mL

## 2017-11-10 LAB — PROLACTIN: Prolactin: 3.7 ng/mL — ABNORMAL LOW (ref 4.8–23.3)

## 2017-11-10 LAB — TSH: TSH: 0.816 u[IU]/mL (ref 0.450–4.500)

## 2017-11-18 ENCOUNTER — Ambulatory Visit
Admission: RE | Admit: 2017-11-18 | Discharge: 2017-11-18 | Disposition: A | Discharge status: Home / Self Care | Payer: Medicaid Other | Source: Ambulatory Visit | Attending: Obstetrics & Gynecology | Admitting: Obstetrics & Gynecology

## 2017-11-18 DIAGNOSIS — N643 Galactorrhea not associated with childbirth: Secondary | ICD-10-CM

## 2017-11-18 DIAGNOSIS — R928 Other abnormal and inconclusive findings on diagnostic imaging of breast: Secondary | ICD-10-CM | POA: Insufficient documentation

## 2017-12-01 ENCOUNTER — Ambulatory Visit: Payer: Medicaid Other | Admitting: Obstetrics & Gynecology

## 2017-12-01 ENCOUNTER — Ambulatory Visit (INDEPENDENT_AMBULATORY_CARE_PROVIDER_SITE_OTHER): Payer: Medicaid Other | Admitting: Obstetrics & Gynecology

## 2017-12-01 ENCOUNTER — Ambulatory Visit (INDEPENDENT_AMBULATORY_CARE_PROVIDER_SITE_OTHER): Payer: Medicaid Other

## 2017-12-01 ENCOUNTER — Encounter: Payer: Self-pay | Admitting: Obstetrics & Gynecology

## 2017-12-01 VITALS — BP 102/70 | Ht 67.0 in | Wt 176.0 lb

## 2017-12-01 DIAGNOSIS — N8003 Adenomyosis of the uterus: Secondary | ICD-10-CM | POA: Insufficient documentation

## 2017-12-01 DIAGNOSIS — R102 Pelvic and perineal pain: Secondary | ICD-10-CM

## 2017-12-01 DIAGNOSIS — N946 Dysmenorrhea, unspecified: Secondary | ICD-10-CM

## 2017-12-01 DIAGNOSIS — G8929 Other chronic pain: Secondary | ICD-10-CM

## 2017-12-01 DIAGNOSIS — N809 Endometriosis, unspecified: Secondary | ICD-10-CM

## 2017-12-01 DIAGNOSIS — N800 Endometriosis of uterus: Secondary | ICD-10-CM

## 2017-12-01 NOTE — Patient Instructions (Signed)
Total Laparoscopic Hysterectomy °A total laparoscopic hysterectomy is a minimally invasive surgery to remove your uterus and cervix. This surgery is performed by making several small cuts (incisions) in your abdomen. It can also be done with a thin, lighted tube (laparoscope) inserted into two small incisions in your lower abdomen. Your fallopian tubes and ovaries can be removed (bilateral salpingo-oophorectomy) during this surgery as well. Benefits of minimally invasive surgery include: °· Less pain. °· Less risk of blood loss. °· Less risk of infection. °· Quicker return to normal activities. ° °Tell a health care provider about: °· Any allergies you have. °· All medicines you are taking, including vitamins, herbs, eye drops, creams, and over-the-counter medicines. °· Any problems you or family members have had with anesthetic medicines. °· Any blood disorders you have. °· Any surgeries you have had. °· Any medical conditions you have. °What are the risks? °Generally, this is a safe procedure. However, as with any procedure, complications can occur. Possible complications include: °· Bleeding. °· Blood clots in the legs or lung. °· Infection. °· Injury to surrounding organs. °· Problems with anesthesia. °· Early menopause symptoms (hot flashes, night sweats, insomnia). °· Risk of conversion to an open abdominal incision. ° °What happens before the procedure? °· Ask your health care provider about changing or stopping your regular medicines. °· Do not take aspirin or blood thinners (anticoagulants) for 1 week before the surgery or as told by your health care provider. °· Do not eat or drink anything for 8 hours before the surgery or as told by your health care provider. °· Quit smoking if you smoke. °· Arrange for a ride home after surgery and for someone to help you at home during recovery. °What happens during the procedure? °· You will be given antibiotic medicine. °· An IV tube will be placed in your arm. You  will be given medicine to make you sleep (general anesthetic). °· A gas (carbon dioxide) will be used to inflate your abdomen. This will allow your surgeon to look inside your abdomen, perform your surgery, and treat any other problems found if necessary. °· Three or four small incisions (often less than 1/2 inch) will be made in your abdomen. One of these incisions will be made in the area of your belly button (navel). The laparoscope will be inserted into the incision. Your surgeon will look through the laparoscope while doing your procedure. °· Other surgical instruments will be inserted through the other incisions. °· Your uterus may be removed through your vagina or cut into small pieces and removed through the small incisions. °· Your incisions will be closed. °What happens after the procedure? °· The gas will be released from inside your abdomen. °· You will be taken to the recovery area where a nurse will watch and check your progress. Once you are awake, stable, and taking fluids well, without other problems, you will return to your room or be allowed to go home. °· There is usually minimal discomfort following the surgery because the incisions are so small. °· You will be given pain medicine while you are in the hospital and for when you go home. °This information is not intended to replace advice given to you by your health care provider. Make sure you discuss any questions you have with your health care provider. °Document Released: 08/08/2007 Document Revised: 03/18/2016 Document Reviewed: 05/01/2013 °Elsevier Interactive Patient Education © 2017 [employer]. ° °

## 2017-12-01 NOTE — Progress Notes (Signed)
HPI: Pt has h/o 4 YEARS of lower abd pain and menorrhagia, pain assoc w periods without radiation, past OCP and Depo no help or w SE, no other assoc sx's. Mod- severe. Prior CS, BTL.  IBF min help.  Ultrasound demonstrates findings c/w adenomyosis  PMHx: She  has a past medical history of Asthma and Endometriosis. Also,  has a past surgical history that includes Cesarean section., family history includes Breast cancer in her mother; Cancer in her maternal grandfather, paternal aunt, and paternal aunt.,  reports that she has been smoking.  she has never used smokeless tobacco. She reports that she drinks alcohol. She reports that she does not use drugs.  She has a current medication list which includes the following prescription(s): albuterol, dicyclomine, ibuprofen, metronidazole, ondansetron, and sulfamethoxazole-trimethoprim. Also, has No Known Allergies.  Review of Systems  All other systems reviewed and are negative.  Objective: BP 102/70   Ht 5\' 7"  (1.702 m)   Wt 176 lb (79.8 kg)   LMP 11/06/2017   BMI 27.57 kg/m   Physical examination Constitutional NAD, Conversant  Skin No rashes, lesions or ulceration.   Extremities: Moves all appropriately.  Normal ROM for age. No lymphadenopathy.  Neuro: Grossly intact  Psych: Oriented to PPT.  Normal mood. Normal affect.   US Pelvis Transvanginal Non-ob (tv Only)  Result Date: 12/01/2017 ULTRASOUND REPORT Location: [facility] Date of Service: 12/01/2017 Indications:Pelvic Pain Findings: The uterus is anteverted and measures 9.1 x 5.92 x 4.62cm. Echo texture is heterogenous without evidence of focal masses. - question Adenomyosis The Endometrium measures 5.18 mm. Right Ovary measures 3.16 x 2.87 x 1.35 cm. It is normal in appearance. Left Ovary measures 3.18 x 3.22 x 1.79 cm. It is normal in appearance. Survey of the adnexa demonstrates no adnexal masses. There is no free fluid in the cul de sac. Incidental note: large amount of  fluid-filled bowel Impression: 1. Heterogeneous uterus suggesting Adenomyosis 2. Large amount of fluid-filled bowel Recommendations: 1.Clinical correlation with the patient's History and Physical Exam. Willette Alma, RDMS, RVT Review of ULTRASOUND.    I have personally reviewed images and report of recent ultrasound done at [facility].    Plan of management to be discussed with patient. Annamarie Major, MD, FACOG [facility], [facility] 12/01/2017  11:30 AM   Assessment:  Dysmenorrhea  Chronic pelvic pain in female  Adenomyosis  Optins discussed as to investigation for endometrisois or other etiologies vs treatment of adenomyosis and pain, which would encompass endometriosis as well.  Ablation may help bleeding and dysmenorrhea, but may not be as successful in light of adenomyosis. Pt prefers hysterectomy. Laparoscopy discussed. Preservation of ovaries discussed.  I have had a careful discussion with this patient about all the options available and the risk/benefits of each. I have fully informed this patient that a hysterectomy may subject her to a variety of discomforts and risks: She understands that most patients have surgery with little difficulty, but problems can happen ranging from minor to fatal. These include nausea, vomiting, pain, bleeding, infection, poor healing, hernia, wound separation, vaginal cuff separation or formation of adhesions. Unexpected reactions may occur from any drug or anesthetic given. Unintended injury may occur to other pelvic or abdominal structures such as Fallopian tubes, ovaries, bladder, ureter (tube from kidney to bladder), or bowel. Nerves going from the pelvis to the legs may be injured. Any such injury may require immediate or later additional surgery to correct the problem. Excessive blood loss requiring transfusion is  very unlikely but possible. Dangerous blood clots may form in the legs or lungs. Physical and sexual activity will be  restricted in varying degrees for an indeterminate period of time but most often 2-4 weeks. She understands that the plan is to do this laparoscopically, however, there is a chance that this will need to be performed via a larger incision. She may be hospitalized overnight. Finally, she understands that it is impossible to list every possible undesirable effect and that the condition for which surgery is done is not always cured or significantly improved, and in rare cases may be even worse. I have also counseled her extensively about the pros and cons of ovarian conservation versus removal. Ample time was given to answer all questions.  A total of 15 minutes were spent face-to-face with the patient during this encounter and over half of that time dealt with counseling and coordination of care.  Annamarie MajorPaul Harris, MD, Merlinda FrederickFACOG [facility], [facility] 12/01/2017  11:30 AM

## 2017-12-02 ENCOUNTER — Telehealth: Payer: Self-pay | Admitting: Obstetrics & Gynecology

## 2017-12-02 NOTE — Telephone Encounter (Signed)
[facility]20[facility] w/ Dr. Tiburcio PeaHarris, Pre-admit Testing phone interview to be scheduled, and OR on 12/22/17.

## 2017-12-02 NOTE — Telephone Encounter (Signed)
-----   Message from Melinda Mustardobert P Harris, MD sent at 12/01/2017 11:28 AM EST ----- Regarding: SURG Surgery Booking Request Patient Full Name:  Melinda Olsen  MRN: [id number]  DOB: [birth year]  Surgeon: Letitia Libraobert Paul Harris, MD  Requested Surgery Date and Time: Dec 22 2017 Primary Diagnosis AND Code: N94.6, N80, R10.2 Secondary Diagnosis and Code: G89.29 Surgical Procedure: TLH/BS L&D Notification: No Admission Status: same day surgery Length of Surgery: 1 hr Special Case Needs: no H&P: yes (date) Phone Interview???: yes Interpreter: Language:  Medical Clearance: no Special Scheduling Instructions: no

## 2017-12-15 ENCOUNTER — Encounter: Payer: Self-pay | Admitting: Obstetrics & Gynecology

## 2017-12-15 ENCOUNTER — Ambulatory Visit (INDEPENDENT_AMBULATORY_CARE_PROVIDER_SITE_OTHER): Payer: Medicaid Other | Admitting: Obstetrics & Gynecology

## 2017-12-15 VITALS — BP 100/70 | HR 98 | Ht 67.0 in | Wt 175.0 lb

## 2017-12-15 DIAGNOSIS — R102 Pelvic and perineal pain: Secondary | ICD-10-CM | POA: Diagnosis not present

## 2017-12-15 DIAGNOSIS — N800 Endometriosis of uterus: Secondary | ICD-10-CM | POA: Diagnosis not present

## 2017-12-15 DIAGNOSIS — N946 Dysmenorrhea, unspecified: Secondary | ICD-10-CM

## 2017-12-15 DIAGNOSIS — G8929 Other chronic pain: Secondary | ICD-10-CM

## 2017-12-15 DIAGNOSIS — N809 Endometriosis, unspecified: Secondary | ICD-10-CM

## 2017-12-15 DIAGNOSIS — N8003 Adenomyosis of the uterus: Secondary | ICD-10-CM

## 2017-12-15 NOTE — Patient Instructions (Signed)

## 2017-12-15 NOTE — Progress Notes (Signed)
PRE-OPERATIVE HISTORY AND PHYSICAL EXAM  HPI:  Melinda Olsen is a 42 y.o. [id number] Patient's last menstrual period was 11/29/2017.; she is being admitted for surgery related to pelvic pain. Pt has h/o 4 YEARS of lower abd pain and menorrhagia, pain assoc w periods without radiation, past OCP and Depo no help or w SE, no other assoc sx's. Mod- severe. Prior CS, BTL.  IBF min help. Ultrasound demonstrates findings c/w adenomyosis  PMHx: Past Medical History:  Diagnosis Date  . Asthma   . Endometriosis    Past Surgical History:  Procedure Laterality Date  . CESAREAN SECTION     Family History  Problem Relation Age of Onset  . Breast cancer Mother   . Cancer Paternal Aunt   . Cancer Maternal Grandfather   . Cancer Paternal Aunt    Social History   Tobacco Use  . Smoking status: Current Every Day Smoker  . Smokeless tobacco: Never Used  Substance Use Topics  . Alcohol use: Yes  . Drug use: No    Current Outpatient Medications:  .  albuterol (PROVENTIL HFA;VENTOLIN HFA) 108 (90 Base) MCG/ACT inhaler, Inhale 2 puffs into the lungs every 4 (four) hours as needed for wheezing or shortness of breath., Disp: 1 Inhaler, Rfl: 0 .  ibuprofen (ADVIL,MOTRIN) 200 MG tablet, Take 1,000 mg by mouth every 8 (eight) hours as needed (FOR PAIN.). , Disp: , Rfl:  .  dicyclomine (BENTYL) 20 MG tablet, Take 1 tablet (20 mg total) by mouth 3 (three) times daily as needed for spasms. (Patient not taking: Reported on 12/08/2017), Disp: 30 tablet, Rfl: 0 .  metroNIDAZOLE (FLAGYL) 500 MG tablet, Take 1 tablet (500 mg total) by mouth 2 (two) times daily. (Patient not taking: Reported on 12/08/2017), Disp: 20 tablet, Rfl: 0 .  ondansetron (ZOFRAN) 4 MG tablet, Take 1 tablet (4 mg total) by mouth daily as needed. (Patient not taking: Reported on 12/08/2017), Disp: 10 tablet, Rfl: 0 .  sulfamethoxazole-trimethoprim (BACTRIM DS,SEPTRA DS) 800-160 MG tablet, Take 1 tablet by mouth 2 (two) times daily. (Patient  not taking: Reported on 12/08/2017), Disp: 20 tablet, Rfl: 0 Allergies: Patient has no known allergies.  Review of Systems  Constitutional: Negative for chills, fever and malaise/fatigue.  HENT: Negative for congestion, sinus pain and sore throat.   Eyes: Negative for blurred vision and pain.  Respiratory: Negative for cough and wheezing.   Cardiovascular: Negative for chest pain and leg swelling.  Gastrointestinal: Negative for abdominal pain, constipation, diarrhea, heartburn, nausea and vomiting.  Genitourinary: Negative for dysuria, frequency, hematuria and urgency.  Musculoskeletal: Negative for back pain, joint pain, myalgias and neck pain.  Skin: Negative for itching and rash.  Neurological: Negative for dizziness, tremors and weakness.  Endo/Heme/Allergies: Does not bruise/bleed easily.  Psychiatric/Behavioral: Negative for depression. The patient is not nervous/anxious and does not have insomnia.    Objective: BP 100/70   Pulse 98   Ht 5\' 7"  (1.702 m)   Wt 175 lb (79.4 kg)   LMP 11/29/2017   BMI 27.41 kg/m   Filed Weights   12/15/17 1017  Weight: 175 lb (79.4 kg)   Physical Exam  Constitutional: She is oriented to person, place, and time. She appears well-developed and well-nourished. No distress.  Genitourinary: Rectum normal, vagina normal and uterus normal. Pelvic exam was performed with patient supine. There is no rash or lesion on the right labia. There is no rash or lesion on the left labia. Vagina exhibits no lesion.  No bleeding in the vagina. Right adnexum does not display mass and does not display tenderness. Left adnexum does not display mass and does not display tenderness. Cervix does not exhibit motion tenderness, lesion, friability or polyp.   Uterus is mobile and midaxial. Uterus is not enlarged or exhibiting a mass.  HENT:  Head: Normocephalic and atraumatic. Head is without laceration.  Right Ear: Hearing normal.  Left Ear: Hearing normal.  Nose: No  epistaxis.  No foreign bodies.  Mouth/Throat: Uvula is midline, oropharynx is clear and moist and mucous membranes are normal.  Eyes: Pupils are equal, round, and reactive to light.  Neck: Normal range of motion. Neck supple. No thyromegaly present.  Cardiovascular: Normal rate and regular rhythm. Exam reveals no gallop and no friction rub.  No murmur heard. Pulmonary/Chest: Effort normal and breath sounds normal. No respiratory distress. She has no wheezes. Right breast exhibits no mass, no skin change and no tenderness. Left breast exhibits no mass, no skin change and no tenderness.  Abdominal: Soft. Bowel sounds are normal. She exhibits no distension. There is no tenderness. There is no rebound.  Musculoskeletal: Normal range of motion.  Neurological: She is alert and oriented to person, place, and time. No cranial nerve deficit.  Skin: Skin is warm and dry.  Psychiatric: She has a normal mood and affect. Judgment normal.  Vitals reviewed.  Assessment: 1. Dysmenorrhea   2. Chronic pelvic pain in female   3. Adenomyosis   All options discussed. Prefers hysterectomy, preservation of ovaries. Pelvic pain may be well treated or only partially treated by this approach.  Alternatives discussed.  Endometriosis discussed.   Possibility for future oophorectomy discussed.  I have had a careful discussion with this patient about all the options available and the risk/benefits of each. I have fully informed this patient that surgery may subject her to a variety of discomforts and risks: She understands that most patients have surgery with little difficulty, but problems can happen ranging from minor to fatal. These include nausea, vomiting, pain, bleeding, infection, poor healing, hernia, or formation of adhesions. Unexpected reactions may occur from any drug or anesthetic given. Unintended injury may occur to other pelvic or abdominal structures such as Fallopian tubes, ovaries, bladder, ureter (tube  from kidney to bladder), or bowel. Nerves going from the pelvis to the legs may be injured. Any such injury may require immediate or later additional surgery to correct the problem. Excessive blood loss requiring transfusion is very unlikely but possible. Dangerous blood clots may form in the legs or lungs. Physical and sexual activity will be restricted in varying degrees for an indeterminate period of time but most often 2-6 weeks.  Finally, she understands that it is impossible to list every possible undesirable effect and that the condition for which surgery is done is not always cured or significantly improved, and in rare cases may be even worse.Ample time was given to answer all questions.  Annamarie Major, MD, [facility], [facility] 12/15/2017  10:45 AM

## 2017-12-16 ENCOUNTER — Encounter
Admission: RE | Admit: 2017-12-16 | Discharge: 2017-12-16 | Disposition: A | Discharge status: Home / Self Care | Payer: Medicaid Other | Source: Ambulatory Visit | Attending: Obstetrics & Gynecology | Admitting: Obstetrics & Gynecology

## 2017-12-16 ENCOUNTER — Other Ambulatory Visit: Payer: Self-pay

## 2017-12-16 HISTORY — DX: Dyspnea, unspecified: R06.00

## 2017-12-16 HISTORY — DX: Unspecified convulsions: R56.9

## 2017-12-16 HISTORY — DX: Anxiety disorder, unspecified: F41.9

## 2017-12-16 HISTORY — DX: Family history of other specified conditions: Z84.89

## 2017-12-16 HISTORY — DX: Depression, unspecified: F32.A

## 2017-12-16 HISTORY — DX: Major depressive disorder, single episode, unspecified: F32.9

## 2017-12-16 NOTE — Patient Instructions (Signed)
Your procedure is scheduled on: 12-22-17 THURSDAY Report to Same Day Surgery 2nd floor medical mall [facility](Medical Mall Entrance-take elevator on left to 2nd floor.  Check in with surgery information desk.) To find out your arrival time please call [phone] between 1PM - 3PM on 12[facility]-17 [facility]  Remember: Instructions that are not followed completely may result in serious medical risk, up to and including death, or upon the discretion of your surgeon and anesthesiologist your surgery may need to be rescheduled.    _x___ 1. Do not eat food after midnight the night before your procedure. NO GUM OR CANDY AFTER MIDNIGHT.  You may drink clear liquids up to 2 hours before you are scheduled to arrive at the hospital for your procedure.  Do not drink clear liquids within 2 hours of your scheduled arrival to the hospital.  Clear liquids include  --Water or Apple juice without pulp  --Clear carbohydrate beverage such as ClearFast or Gatorade  --Black Coffee or Clear Tea (No milk, no creamers, do not add anything to the coffee or Tea     __x__ 2. No Alcohol for 24 hours before or after surgery.   __x__3. No Smoking or e-cigarettes for 24 prior to surgery.  Do not use any chewable tobacco products for at least 6 hour prior to surgery   ____  4. Bring all medications with you on the day of surgery if instructed.    __x__ 5. Notify your doctor if there is any change in your medical condition     (cold, fever, infections).    x___6. On the morning of surgery brush your teeth with toothpaste and water.  You may rinse your mouth with mouth wash if you wish.  Do not swallow any toothpaste or mouthwash.   Do not wear jewelry, make-up, hairpins, clips or nail polish.  Do not wear lotions, powders, or perfumes. You may wear deodorant.  Do not shave 48 hours prior to surgery. Men may shave face and neck.  Do not bring valuables to the hospital.    [facility] is not responsible for any belongings or  valuables.               Contacts, dentures or bridgework may not be worn into surgery.  Leave your suitcase in the car. After surgery it may be brought to your room.  For patients admitted to the hospital, discharge time is determined by your treatment team.  _  Patients discharged the day of surgery will not be allowed to drive home.  You will need someone to drive you home and stay with you the night of your procedure.    Please read over the following fact sheets that you were given:   [facility] Preparing for [facility] and or MRSA Information   ____ Take anti-hypertensive listed below, cardiac, seizure, asthma, anti-reflux and psychiatric medicines. These include:  1. NONE  2.  3.  4.  5.  6.  ____Fleets enema or Magnesium Citrate as directed.   _x___ Use CHG Soap or sage wipes as directed on instruction sheet   _X___ Use inhalers on the day of surgery and bring to hospital day of surgery-USE YOUR ALBUTEROL INHALER AM OF SURGERY AND BRING INHALER TO HOSPITAL  ____ Stop Metformin and Janumet 2 days prior to surgery.    ____ Take 1/2 of usual insulin dose the night before surgery and none on the morning surgery.   ____ Follow recommendations from Cardiologist, Pulmonologist or PCP regarding  stopping Aspirin, Coumadin, Plavix ,Eliquis, Effient, or Pradaxa, and Pletal.  X____Stop Anti-inflammatories such as Advil, Aleve, IBUPROFEN, Motrin, Naproxen, Naprosyn, Goodies powders or aspirin products NOW- OK to take Tylenol    ____ Stop supplements until after surgery.   ____ Bring C-Pap to the hospital.

## 2017-12-19 ENCOUNTER — Encounter
Admission: RE | Admit: 2017-12-19 | Discharge: 2017-12-19 | Disposition: A | Discharge status: Home / Self Care | Payer: Medicaid Other | Source: Ambulatory Visit | Attending: Obstetrics & Gynecology | Admitting: Obstetrics & Gynecology

## 2017-12-19 DIAGNOSIS — R102 Pelvic and perineal pain: Secondary | ICD-10-CM | POA: Diagnosis not present

## 2017-12-19 DIAGNOSIS — R0602 Shortness of breath: Secondary | ICD-10-CM | POA: Insufficient documentation

## 2017-12-19 DIAGNOSIS — N946 Dysmenorrhea, unspecified: Secondary | ICD-10-CM | POA: Insufficient documentation

## 2017-12-19 DIAGNOSIS — N800 Endometriosis of uterus: Secondary | ICD-10-CM | POA: Insufficient documentation

## 2017-12-19 DIAGNOSIS — F17200 Nicotine dependence, unspecified, uncomplicated: Secondary | ICD-10-CM | POA: Insufficient documentation

## 2017-12-19 DIAGNOSIS — Z01818 Encounter for other preprocedural examination: Secondary | ICD-10-CM | POA: Diagnosis present

## 2017-12-19 DIAGNOSIS — G8929 Other chronic pain: Secondary | ICD-10-CM | POA: Insufficient documentation

## 2017-12-19 LAB — BASIC METABOLIC PANEL
Anion gap: 6 (ref 5–15)
BUN: 10 mg/dL (ref 6–20)
CO2: 23 mmol/L (ref 22–32)
Calcium: 8 mg/dL — ABNORMAL LOW (ref 8.9–10.3)
Chloride: 105 mmol/L (ref 101–111)
Creatinine, Ser: 0.91 mg/dL (ref 0.44–1.00)
GFR calc Af Amer: >60 mL/min (ref >60)
GLUCOSE: 100 mg/dL — AB (ref 65–99)
Potassium: 3.3 mmol/L — ABNORMAL LOW (ref 3.5–5.1)
Sodium: 134 mmol/L — ABNORMAL LOW (ref 135–145)

## 2017-12-19 LAB — CBC
HEMATOCRIT: 39.3 % (ref 35.0–47.0)
Hemoglobin: 13.1 g/dL (ref 12.0–16.0)
MCH: 31.8 pg (ref 26.0–34.0)
MCHC: 33.3 g/dL (ref 32.0–36.0)
MCV: 95.4 fL (ref 80.0–100.0)
Platelets: 308 10*3/uL (ref 150–440)
RBC: 4.13 MIL/uL (ref 3.80–5.20)
RDW: 14.3 % (ref 11.5–14.5)
WBC: 8.7 10*3/uL (ref 3.6–11.0)

## 2017-12-19 LAB — TYPE AND SCREEN
ABO/RH(D): A POS
ANTIBODY SCREEN: NEGATIVE

## 2017-12-21 ENCOUNTER — Encounter: Payer: Self-pay | Admitting: *Deleted

## 2017-12-21 MED ORDER — CEFOXITIN SODIUM-DEXTROSE 2-2.2 GM-%(50ML) IV SOLR
2.0000 g | INTRAVENOUS | Status: AC
  Administered 2017-12-22: 2 g via INTRAVENOUS

## 2017-12-22 ENCOUNTER — Encounter: Payer: Self-pay | Admitting: *Deleted

## 2017-12-22 ENCOUNTER — Ambulatory Visit
Admission: RE | Admit: 2017-12-22 | Discharge: 2017-12-22 | Disposition: A | Discharge status: Home / Self Care | Payer: Medicaid Other | Source: Ambulatory Visit | Attending: Obstetrics & Gynecology | Admitting: Obstetrics & Gynecology

## 2017-12-22 ENCOUNTER — Encounter
Admission: RE | Disposition: A | Discharge status: Home / Self Care | Payer: Self-pay | Source: Ambulatory Visit | Attending: Obstetrics & Gynecology

## 2017-12-22 ENCOUNTER — Ambulatory Visit: Payer: Medicaid Other | Admitting: Registered Nurse

## 2017-12-22 DIAGNOSIS — J45909 Unspecified asthma, uncomplicated: Secondary | ICD-10-CM | POA: Diagnosis not present

## 2017-12-22 DIAGNOSIS — Z7951 Long term (current) use of inhaled steroids: Secondary | ICD-10-CM | POA: Diagnosis not present

## 2017-12-22 DIAGNOSIS — G8929 Other chronic pain: Secondary | ICD-10-CM | POA: Diagnosis not present

## 2017-12-22 DIAGNOSIS — N8003 Adenomyosis of the uterus: Secondary | ICD-10-CM | POA: Diagnosis present

## 2017-12-22 DIAGNOSIS — Z79899 Other long term (current) drug therapy: Secondary | ICD-10-CM | POA: Diagnosis not present

## 2017-12-22 DIAGNOSIS — N800 Endometriosis of uterus: Secondary | ICD-10-CM | POA: Insufficient documentation

## 2017-12-22 DIAGNOSIS — R102 Pelvic and perineal pain: Secondary | ICD-10-CM | POA: Insufficient documentation

## 2017-12-22 DIAGNOSIS — N946 Dysmenorrhea, unspecified: Secondary | ICD-10-CM | POA: Diagnosis present

## 2017-12-22 DIAGNOSIS — N838 Other noninflammatory disorders of ovary, fallopian tube and broad ligament: Secondary | ICD-10-CM | POA: Diagnosis not present

## 2017-12-22 DIAGNOSIS — Z803 Family history of malignant neoplasm of breast: Secondary | ICD-10-CM | POA: Insufficient documentation

## 2017-12-22 DIAGNOSIS — N809 Endometriosis, unspecified: Secondary | ICD-10-CM

## 2017-12-22 DIAGNOSIS — N840 Polyp of corpus uteri: Secondary | ICD-10-CM | POA: Insufficient documentation

## 2017-12-22 DIAGNOSIS — F418 Other specified anxiety disorders: Secondary | ICD-10-CM | POA: Diagnosis not present

## 2017-12-22 HISTORY — PX: CYSTOSCOPY: SHX5120

## 2017-12-22 HISTORY — PX: LAPAROSCOPIC HYSTERECTOMY: SHX1926

## 2017-12-22 LAB — POCT PREGNANCY, URINE: Preg Test, Ur: NEGATIVE

## 2017-12-22 LAB — POCT I-STAT 4, (NA,K, GLUC, HGB,HCT)
Glucose, Bld: 98 mg/dL (ref 65–99)
HEMATOCRIT: 37 % (ref 36.0–46.0)
HEMOGLOBIN: 12.6 g/dL (ref 12.0–15.0)
Potassium: 3.4 mmol/L — ABNORMAL LOW (ref 3.5–5.1)
Sodium: 141 mmol/L (ref 135–145)

## 2017-12-22 LAB — ABO/RH: ABO/RH(D): A POS

## 2017-12-22 SURGERY — HYSTERECTOMY, TOTAL, LAPAROSCOPIC
Anesthesia: General | Laterality: Bilateral

## 2017-12-22 MED ORDER — OXYCODONE-ACETAMINOPHEN 5-325 MG PO TABS: 1.0000 | ORAL_TABLET | ORAL | 0 refills | Status: DC | PRN

## 2017-12-22 MED ORDER — PROMETHAZINE HCL 25 MG/ML IJ SOLN: 6.2500 mg | INTRAMUSCULAR | Status: DC | PRN

## 2017-12-22 MED ORDER — KETOROLAC TROMETHAMINE 30 MG/ML IJ SOLN
INTRAMUSCULAR | Status: AC
  Filled 2017-12-22: qty 1

## 2017-12-22 MED ORDER — KETOROLAC TROMETHAMINE 30 MG/ML IJ SOLN
INTRAMUSCULAR | Status: DC | PRN
  Administered 2017-12-22: 30 mg via INTRAVENOUS

## 2017-12-22 MED ORDER — SUCCINYLCHOLINE CHLORIDE 20 MG/ML IJ SOLN
INTRAMUSCULAR | Status: AC
  Filled 2017-12-22: qty 1

## 2017-12-22 MED ORDER — FENTANYL CITRATE (PF) 100 MCG/2ML IJ SOLN
INTRAMUSCULAR | Status: AC
  Filled 2017-12-22: qty 2

## 2017-12-22 MED ORDER — ACETAMINOPHEN 650 MG RE SUPP
650.0000 mg | RECTAL | Status: DC | PRN
  Filled 2017-12-22: qty 1

## 2017-12-22 MED ORDER — LIDOCAINE HCL (CARDIAC) 20 MG/ML IV SOLN
INTRAVENOUS | Status: DC | PRN
  Administered 2017-12-22: 80 mg via INTRAVENOUS

## 2017-12-22 MED ORDER — OXYCODONE HCL 5 MG/5ML PO SOLN: 5.0000 mg | Freq: Once | ORAL | Status: AC | PRN

## 2017-12-22 MED ORDER — MIDAZOLAM HCL 2 MG/2ML IJ SOLN
INTRAMUSCULAR | Status: AC
  Filled 2017-12-22: qty 2

## 2017-12-22 MED ORDER — BUPIVACAINE HCL (PF) 0.5 % IJ SOLN
INTRAMUSCULAR | Status: DC | PRN
  Administered 2017-12-22: 14 mL

## 2017-12-22 MED ORDER — MEPERIDINE HCL 50 MG/ML IJ SOLN: 6.2500 mg | INTRAMUSCULAR | Status: DC | PRN

## 2017-12-22 MED ORDER — BUPIVACAINE HCL (PF) 0.5 % IJ SOLN
INTRAMUSCULAR | Status: AC
  Filled 2017-12-22: qty 30

## 2017-12-22 MED ORDER — DIPHENHYDRAMINE HCL 50 MG/ML IJ SOLN
INTRAMUSCULAR | Status: AC
  Filled 2017-12-22: qty 1

## 2017-12-22 MED ORDER — CEFOXITIN SODIUM-DEXTROSE 2-2.2 GM-%(50ML) IV SOLR
INTRAVENOUS | Status: AC
  Filled 2017-12-22: qty 50

## 2017-12-22 MED ORDER — MORPHINE SULFATE (PF) 4 MG/ML IV SOLN: 1.0000 mg | INTRAVENOUS | Status: DC | PRN

## 2017-12-22 MED ORDER — LIDOCAINE HCL (PF) 2 % IJ SOLN
INTRAMUSCULAR | Status: AC
  Filled 2017-12-22: qty 10

## 2017-12-22 MED ORDER — DEXAMETHASONE SODIUM PHOSPHATE 10 MG/ML IJ SOLN
INTRAMUSCULAR | Status: AC
  Filled 2017-12-22: qty 1

## 2017-12-22 MED ORDER — ACETAMINOPHEN 325 MG PO TABS: 650.0000 mg | ORAL_TABLET | ORAL | Status: DC | PRN

## 2017-12-22 MED ORDER — SODIUM CHLORIDE 0.9 % IJ SOLN
INTRAMUSCULAR | Status: AC
  Filled 2017-12-22: qty 10

## 2017-12-22 MED ORDER — PROPOFOL 10 MG/ML IV BOLUS
INTRAVENOUS | Status: DC | PRN
  Administered 2017-12-22: 200 mg via INTRAVENOUS

## 2017-12-22 MED ORDER — ROCURONIUM BROMIDE 50 MG/5ML IV SOLN
INTRAVENOUS | Status: AC
  Filled 2017-12-22: qty 1

## 2017-12-22 MED ORDER — LACTATED RINGERS IV SOLN: INTRAVENOUS | Status: DC

## 2017-12-22 MED ORDER — EPHEDRINE SULFATE 50 MG/ML IJ SOLN
INTRAMUSCULAR | Status: AC
  Filled 2017-12-22: qty 1

## 2017-12-22 MED ORDER — FAMOTIDINE 20 MG PO TABS
ORAL_TABLET | ORAL | Status: AC
  Filled 2017-12-22: qty 1

## 2017-12-22 MED ORDER — LACTATED RINGERS IV SOLN
INTRAVENOUS | Status: DC
  Administered 2017-12-22 (×2): via INTRAVENOUS

## 2017-12-22 MED ORDER — SUGAMMADEX SODIUM 200 MG/2ML IV SOLN
INTRAVENOUS | Status: DC | PRN
  Administered 2017-12-22: 200 mg via INTRAVENOUS

## 2017-12-22 MED ORDER — OXYCODONE HCL 5 MG PO TABS
ORAL_TABLET | ORAL | Status: AC
  Filled 2017-12-22: qty 1

## 2017-12-22 MED ORDER — FENTANYL CITRATE (PF) 100 MCG/2ML IJ SOLN
INTRAMUSCULAR | Status: DC | PRN
  Administered 2017-12-22: 100 ug via INTRAVENOUS

## 2017-12-22 MED ORDER — PROPOFOL 10 MG/ML IV BOLUS
INTRAVENOUS | Status: AC
  Filled 2017-12-22: qty 20

## 2017-12-22 MED ORDER — FENTANYL CITRATE (PF) 100 MCG/2ML IJ SOLN
25.0000 ug | INTRAMUSCULAR | Status: AC | PRN
  Administered 2017-12-22 (×6): 25 ug via INTRAVENOUS

## 2017-12-22 MED ORDER — OXYCODONE HCL 5 MG PO TABS
5.0000 mg | ORAL_TABLET | Freq: Once | ORAL | Status: AC | PRN
  Administered 2017-12-22: 5 mg via ORAL

## 2017-12-22 MED ORDER — DIPHENHYDRAMINE HCL 50 MG/ML IJ SOLN
INTRAMUSCULAR | Status: DC | PRN
Start: 2017-12-22 — End: 2017-12-22
  Administered 2017-12-22: 12.5 mg via INTRAVENOUS

## 2017-12-22 MED ORDER — HYDROMORPHONE HCL 1 MG/ML IJ SOLN
INTRAMUSCULAR | Status: DC | PRN
  Administered 2017-12-22 (×2): 0.5 mg via INTRAVENOUS

## 2017-12-22 MED ORDER — ROCURONIUM BROMIDE 100 MG/10ML IV SOLN
INTRAVENOUS | Status: DC | PRN
  Administered 2017-12-22: 45 mg via INTRAVENOUS
  Administered 2017-12-22: 5 mg via INTRAVENOUS

## 2017-12-22 MED ORDER — PHENYLEPHRINE HCL 10 MG/ML IJ SOLN
INTRAMUSCULAR | Status: DC | PRN
  Administered 2017-12-22: 100 ug via INTRAVENOUS

## 2017-12-22 MED ORDER — ONDANSETRON HCL 4 MG/2ML IJ SOLN
INTRAMUSCULAR | Status: DC | PRN
  Administered 2017-12-22: 4 mg via INTRAVENOUS

## 2017-12-22 MED ORDER — ONDANSETRON HCL 4 MG/2ML IJ SOLN
INTRAMUSCULAR | Status: AC
  Filled 2017-12-22: qty 2

## 2017-12-22 MED ORDER — HYDROMORPHONE HCL 1 MG/ML IJ SOLN
INTRAMUSCULAR | Status: AC
Start: 2017-12-22 — End: ?
  Filled 2017-12-22: qty 1

## 2017-12-22 MED ORDER — MIDAZOLAM HCL 2 MG/2ML IJ SOLN
INTRAMUSCULAR | Status: DC | PRN
  Administered 2017-12-22: 2 mg via INTRAVENOUS

## 2017-12-22 MED ORDER — KETOROLAC TROMETHAMINE 30 MG/ML IJ SOLN: 30.0000 mg | Freq: Four times a day (QID) | INTRAMUSCULAR | Status: DC

## 2017-12-22 MED ORDER — DEXAMETHASONE SODIUM PHOSPHATE 10 MG/ML IJ SOLN
INTRAMUSCULAR | Status: DC | PRN
  Administered 2017-12-22: 5 mg via INTRAVENOUS

## 2017-12-22 MED ORDER — FAMOTIDINE 20 MG PO TABS
20.0000 mg | ORAL_TABLET | Freq: Once | ORAL | Status: AC
  Administered 2017-12-22: 20 mg via ORAL

## 2017-12-22 SURGICAL SUPPLY — 50 items
BAG URINE DRAINAGE (UROLOGICAL SUPPLIES) ×4 IMPLANT
BLADE SURG SZ11 CARB STEEL (BLADE) ×4 IMPLANT
CANISTER SUCT 1200ML W/VALVE (MISCELLANEOUS) ×4 IMPLANT
CATH FOLEY 2WAY  5CC 16FR (CATHETERS) ×2
CATH URTH 16FR FL 2W BLN LF (CATHETERS) ×2 IMPLANT
CHLORAPREP W/TINT 26ML (MISCELLANEOUS) ×4 IMPLANT
DEFOGGER SCOPE WARMER CLEARIFY (MISCELLANEOUS) ×4 IMPLANT
DERMABOND ADVANCED (GAUZE/BANDAGES/DRESSINGS) ×2
DERMABOND ADVANCED .7 DNX12 (GAUZE/BANDAGES/DRESSINGS) ×2 IMPLANT
DEVICE SUTURE ENDOST 10MM (ENDOMECHANICALS) ×4 IMPLANT
DRAPE CAMERA CLOSED 9X96 (DRAPES) ×4 IMPLANT
DRSG TEGADERM 2-3/8X2-3/4 SM (GAUZE/BANDAGES/DRESSINGS) IMPLANT
ENDOSTITCH 0 SINGLE 48 (SUTURE) IMPLANT
GLOVE BIO SURGEON STRL SZ8 (GLOVE) ×20 IMPLANT
GLOVE INDICATOR 8.0 STRL GRN (GLOVE) ×20 IMPLANT
GOWN STRL REUS W/ TWL LRG LVL3 (GOWN DISPOSABLE) ×2 IMPLANT
GOWN STRL REUS W/ TWL XL LVL3 (GOWN DISPOSABLE) ×4 IMPLANT
GOWN STRL REUS W/TWL LRG LVL3 (GOWN DISPOSABLE) ×2
GOWN STRL REUS W/TWL XL LVL3 (GOWN DISPOSABLE) ×4
GRASPER SUT TROCAR 14GX15 (MISCELLANEOUS) ×4 IMPLANT
IRRIGATION STRYKERFLOW (MISCELLANEOUS) ×2 IMPLANT
IRRIGATOR STRYKERFLOW (MISCELLANEOUS) ×4
IV LACTATED RINGERS 1000ML (IV SOLUTION) ×8 IMPLANT
KIT PINK PAD W/HEAD ARE REST (MISCELLANEOUS) ×4
KIT PINK PAD W/HEAD ARM REST (MISCELLANEOUS) ×2 IMPLANT
KIT TURNOVER CYSTO (KITS) ×4 IMPLANT
LABEL OR SOLS (LABEL) ×4 IMPLANT
MANIPULATOR VCARE LG CRV RETR (MISCELLANEOUS) IMPLANT
MANIPULATOR VCARE SML CRV RETR (MISCELLANEOUS) ×4 IMPLANT
MANIPULATOR VCARE STD CRV RETR (MISCELLANEOUS) IMPLANT
NEEDLE VERESS 14GA 120MM (NEEDLE) ×4 IMPLANT
NS IRRIG 500ML POUR BTL (IV SOLUTION) ×4 IMPLANT
OCCLUDER COLPOPNEUMO (BALLOONS) ×4 IMPLANT
PACK GYN LAPAROSCOPIC (MISCELLANEOUS) ×4 IMPLANT
PAD OB MATERNITY 4.3X12.25 (PERSONAL CARE ITEMS) ×4 IMPLANT
PAD PREP 24X41 OB/GYN DISP (PERSONAL CARE ITEMS) ×4 IMPLANT
SCISSORS METZENBAUM CVD 33 (INSTRUMENTS) ×4 IMPLANT
SET CYSTO W/LG BORE CLAMP LF (SET/KITS/TRAYS/PACK) ×4 IMPLANT
SHEARS HARMONIC ACE PLUS 36CM (ENDOMECHANICALS) ×4 IMPLANT
SLEEVE ENDOPATH XCEL 5M (ENDOMECHANICALS) ×4 IMPLANT
SPONGE GAUZE 2X2 8PLY STER LF (GAUZE/BANDAGES/DRESSINGS)
SPONGE GAUZE 2X2 8PLY STRL LF (GAUZE/BANDAGES/DRESSINGS) IMPLANT
SUT ENDO VLOC 180-0-8IN (SUTURE) ×4 IMPLANT
SUT VIC AB 0 CT1 36 (SUTURE) ×4 IMPLANT
SUT VIC AB 4-0 FS2 27 (SUTURE) ×4 IMPLANT
SYR 10ML LL (SYRINGE) ×4 IMPLANT
SYR 50ML LL SCALE MARK (SYRINGE) ×4 IMPLANT
TROCAR ENDO BLADELESS 11MM (ENDOMECHANICALS) ×4 IMPLANT
TROCAR XCEL NON-BLD 5MMX100MML (ENDOMECHANICALS) ×4 IMPLANT
TUBING INSUF HEATED (TUBING) ×4 IMPLANT

## 2017-12-22 NOTE — Anesthesia Procedure Notes (Signed)
Procedure Name: Intubation Date/Time: 12/22/2017 7:33 AM Performed by: Geraldine Contras, CRNA Pre-anesthesia Checklist: Patient identified, Emergency Drugs available, Suction available, Patient being monitored and Timeout performed Patient Re-evaluated:Patient Re-evaluated prior to induction Oxygen Delivery Method: Circle system utilized and Simple face mask Preoxygenation: Pre-oxygenation with 100% oxygen Induction Type: IV induction Ventilation: Mask ventilation without difficulty Laryngoscope Size: Mac and 3 Grade View: Grade I Tube type: Oral Tube size: 7.0 mm Number of attempts: 1 Airway Equipment and Method: Stylet Placement Confirmation: ETT inserted through vocal cords under direct vision,  positive ETCO2 and breath sounds checked- equal and bilateral Secured at: 22 cm Tube secured with: Tape

## 2017-12-22 NOTE — Anesthesia Preprocedure Evaluation (Signed)
Anesthesia Evaluation  Patient identified by MRN, date of birth, ID band Patient awake    Reviewed: Allergy & Precautions, NPO status , Patient's Chart, lab work & pertinent test results  History of Anesthesia Complications Negative for: history of anesthetic complications  Airway Mallampati: II  TM Distance: >3 FB Neck ROM: Full    Dental  (+) Poor Dentition, Implants   Pulmonary asthma , neg sleep apnea, Current Smoker,    breath sounds clear to auscultation- rhonchi (-) wheezing      Cardiovascular Exercise Tolerance: Good (-) hypertension(-) CAD, (-) Past MI, (-) Cardiac Stents and (-) CABG  Rhythm:Regular Rate:Normal - Systolic murmurs and - Diastolic murmurs    Neuro/Psych Seizures -, Well Controlled,  PSYCHIATRIC DISORDERS Anxiety Depression    GI/Hepatic negative GI ROS, Neg liver ROS,   Endo/Other  negative endocrine ROSneg diabetes  Renal/GU negative Renal ROS     Musculoskeletal negative musculoskeletal ROS (+)   Abdominal (+) - obese,   Peds  Hematology negative hematology ROS (+)   Anesthesia Other Findings Past Medical History: No date: Anxiety     Comment:  NO MEDS No date: Asthma     Comment:  WELL CONTROLLED No date: Depression     Comment:  NO MEDS No date: Dyspnea     Comment:  PT STATES CHRONIC SOB (SMOKER) BUT STATES SHE CAN WALK A              MILE WITHOUT GETTING SOB OR HAVING CHEST PAIN No date: Endometriosis No date: Family history of adverse reaction to anesthesia     Comment:  DAD-CAN HEAR SURGEONS TALKING DURING SURGERY BUT NEVER               FEELS ANYTHING 2016: Seizures (HCC)     Comment:  X2 -NO MEDS   Reproductive/Obstetrics                             Anesthesia Physical Anesthesia Plan  ASA: II  Anesthesia Plan: General   Post-op Pain Management:    Induction: Intravenous  PONV Risk Score and Plan: 1  Airway Management Planned: Oral  ETT  Additional Equipment:   Intra-op Plan:   Post-operative Plan: Extubation in OR  Informed Consent: I have reviewed the patients History and Physical, chart, labs and discussed the procedure including the risks, benefits and alternatives for the proposed anesthesia with the patient or authorized representative who has indicated his/her understanding and acceptance.   Dental advisory given  Plan Discussed with: CRNA and Anesthesiologist  Anesthesia Plan Comments:         Anesthesia Quick Evaluation

## 2017-12-22 NOTE — Op Note (Signed)
Operative Report:  PRE-OP DIAGNOSIS: DYSMENORRHEA,ADENOMYOSIS,CHRONIC PELVIC PAIN   POST-OP DIAGNOSIS: DYSMENORRHEA,ADENOMYOSIS,CHRONIC PELVIC PAIN   PROCEDURE: Procedure(s): HYSTERECTOMY TOTAL LAPAROSCOPIC BILATERAL SALPINGECTOMY CYSTOSCOPY  SURGEON: Annamarie MajorPaul Jacquan Savas, MD, FACOG  ASSISTANT: Dr Jean RosenthalJackson   ANESTHESIA: General endotracheal anesthesia  ESTIMATED BLOOD LOSS: 20 mL  SPECIMENS: Uterus, Tubes.  COMPLICATIONS: None  DISPOSITION: stable to PACU  FINDINGS: Intraabdominal adhesions were not noted. Normal ovaries, appendix.  Uterus enlarged.  PROCEDURE:  The patient was taken to the OR where anesthesia was administed. She was prepped and draped in the normal sterile fashion in the dorsal lithotomy position in the [place] stirrups. A time out was performed. A Graves speculum was inserted, the cervix was grasped with a single tooth tenaculum and the endometrial cavity was sounded. The cervix was progressively dilated to a size 18 [place] with [employer] dilators. A V-Care uterine manipulator was inserted in the usual fashion without incident. Gloves were changed and attention was turned to the abdomen.   An infraumbilical transverse 5mm skin incision was made with the scalpel after local anesthesia applied to the skin. A Veress-step needle was inserted in the usual fashion and confirmed using the hanging drop technique. A pneumoperitoneum was obtained by insufflation of CO2 (opening pressure of 4mmHg) to 15mmHg. A diagnostic laparoscopy was performed yielding the previously described findings. Attention was turned to the left lower quadrant where after visualization of the inferior epigastric vessels a 5mm skin incision was made with the scalpel. A 5 mm laparoscopic port was inserted. The same procedure was repeated in the right lower quadrant with a 11mm trocar. Attention was turned to the left aspect of the uterus, where after visualization of the ureter, the round ligament was coagulated and  transected using the 5mm Harmonic Scapel. The anterior and posterior leafs of the broad ligament were dissected off as the anterior one was coagulated and transected in a caudal direction towards the cuff of the uterine manipulator.  Attention was then turned to the left fallopian tube which was recognized by visualization of the fimbria. The tube is excised to its attachment to the uterus. The uterine-ovarian ligament and its blood vessels were carefully coagulated and transected using the Harmonic scapel.  Attention was turned to the right aspect of the uterus where the same procedure was performed.  The vesicouterine reflection of the peritoneum was dissected with the harmonic scapel and the bladder flap was created bluntly.  The uterine vessels were coagulated and transected bilaterally using first bipolar cautery and then the harmonic scapel. A 360 degree, circumferential colpotomy was done to completely amputate the uterus with cervix and tubes. Once the specimen was amputated it was delivered through the vagina.   The colpotomy was repaired in a running fashion using a delayed absorbable suture with an endo-stitch device.  Vaginal exam confirms complete closure.  The cavity was copiously irrigated. A survey of the pelvic cavity revealed adequate hemostasis and no injury to bowel, bladder, or ureter.   A diagnostic cystoscopy was performed using saline distension of bladder with no lesions or injuries noted.  Bilateral urine flow from each ureteral orifice is visualized.  At this point the procedure was finalized. All the instruments were removed from the patient's body. Gas was expelled and patient is leveled.  Incisions are closed with skin adhesive.    Patient goes to recovery room in stable condition.  All sponge, instrument, and needle counts are correct x2.     Annamarie MajorPaul Talana Slatten, MD, Merlinda FrederickFACOG [facility], [facility] 12/22/2017  8:55 AM

## 2017-12-22 NOTE — Anesthesia Post-op Follow-up Note (Signed)
Anesthesia QCDR form completed.        

## 2017-12-22 NOTE — Anesthesia Postprocedure Evaluation (Signed)
Anesthesia Post Note  Patient: Nelma Rothmanancy Ann Everson  Procedure(s) Performed: HYSTERECTOMY TOTAL LAPAROSCOPIC BILATERAL SALPINGECTOMY (Bilateral ) CYSTOSCOPY  Patient location during evaluation: PACU Anesthesia Type: General Level of consciousness: awake and alert and oriented Pain management: pain level controlled Vital Signs Assessment: post-procedure vital signs reviewed and stable Respiratory status: spontaneous breathing, nonlabored ventilation and respiratory function stable Cardiovascular status: blood pressure returned to baseline and stable Postop Assessment: no signs of nausea or vomiting Anesthetic complications: no     Last Vitals:  Vitals:   12/22/17 0940 12/22/17 0945  BP: 121/82   Pulse: 88 79  Resp: 11 11  Temp:  (!) 36.3 C  SpO2: 99% 99%    Last Pain:  Vitals:   12/22/17 0945  TempSrc:   PainSc: 5                  Antuan Limes

## 2017-12-22 NOTE — Discharge Instructions (Signed)
AMBULATORY SURGERY  °DISCHARGE INSTRUCTIONS ° ° °1) The drugs that you were given will stay in your system until tomorrow so for the next 24 hours you should not: ° °A) Drive an automobile °B) Make any legal decisions °C) Drink any alcoholic beverage ° ° °2) You may resume regular meals tomorrow.  Today it is better to start with liquids and gradually work up to solid foods. ° °You may eat anything you prefer, but it is better to start with liquids, then soup and crackers, and gradually work up to solid foods. ° ° °3) Please notify your doctor immediately if you have any unusual bleeding, trouble breathing, redness and pain at the surgery site, drainage, fever, or pain not relieved by medication. °4)  ° °5) Your post-operative visit with Dr.                     °           °     is: Date:                        Time:   ° °Please call to schedule your post-operative visit. ° °6) Additional Instructions: ° ° ° ° ° °Total Laparoscopic Hysterectomy, Care After °Refer to this sheet in the next few weeks. These instructions provide you with information on caring for yourself after your procedure. Your health care provider may also give you more specific instructions. Your treatment has been planned according to current medical practices, but problems sometimes occur. Call your health care provider if you have any problems or questions after your procedure. °What can I expect after the procedure? °· Pain and bruising at the incision sites. You will be given pain medicine to control it. °· Menopausal symptoms such as hot flashes, night sweats, and insomnia if your ovaries were removed. °· Sore throat from the breathing tube that was inserted during surgery. °Follow these instructions at home: °· Only take over-the-counter or prescription medicines for pain, discomfort, or fever as directed by your health care provider. °· Do not take aspirin. It can cause bleeding. °· Do not drive when taking pain medicine. °· Follow your  health care provider's advice regarding diet, exercise, lifting, driving, and general activities. °· Resume your usual diet as directed and allowed. °· Get plenty of rest and sleep. °· Do not douche, use tampons, or have sexual intercourse for at least 6 weeks, or until your health care provider gives you permission. °· Change your bandages (dressings) as directed by your health care provider. °· Monitor your temperature and notify your health care provider of a fever. °· Take showers instead of baths for 2-3 weeks. °· Do not drink alcohol until your health care provider gives you permission. °· If you develop constipation, you may take a mild laxative with your health care provider's permission. Bran foods may help with constipation problems. Drinking enough fluids to keep your urine clear or pale yellow may help as well. °· Try to have someone home with you for 1-2 weeks to help around the house. °· Keep all of your follow-up appointments as directed by your health care provider. °Contact a health care provider if: °· You have swelling, redness, or increasing pain around your incision sites. °· You have pus coming from your incision. °· You notice a bad smell coming from your incision. °· Your incision breaks open. °· You feel dizzy or lightheaded. °· You have pain   or bleeding when you urinate. °· You have persistent diarrhea. °· You have persistent nausea and vomiting. °· You have abnormal vaginal discharge. °· You have a rash. °· You have any type of abnormal reaction or develop an allergy to your medicine. °· You have poor pain control with your prescribed medicine. °Get help right away if: °· You have chest pain or shortness of breath. °· You have severe abdominal pain that is not relieved with pain medicine. °· You have pain or swelling in your legs. °This information is not intended to replace advice given to you by your health care provider. Make sure you discuss any questions you have with your health care  provider. °Document Released: 08/01/2013 Document Revised: 03/18/2016 Document Reviewed: 05/01/2013 °Elsevier Interactive Patient Education © 2017 [employer]. ° °

## 2017-12-22 NOTE — H&P (Signed)
History and Physical Interval Note:  12/22/2017 7:00 AM  Melinda RothmanNancy Ann Pezzullo  has presented today for surgery, [facility] the [facility]VIC PAIN  The various methods of treatment have been discussed with the patient and family. After consideration of risks, benefits and other options for treatment, the patient has consented to  Procedure(s): HYSTERECTOMY TOTAL LAPAROSCOPIC BILATERAL SALPINGECTOMY (Bilateral) as a surgical intervention .  The patient's history has been reviewed, patient examined, no change in status, stable for surgery.  Pt has the following beta blocker history-  Not taking Beta Blocker.  I have reviewed the patient's chart and labs.  Questions were answered to the patient's satisfaction.    Annamarie MajorPaul Breon Diss, MD, Merlinda FrederickFACOG [facility], [facility] 12/22/2017  7:00 AM

## 2017-12-22 NOTE — Transfer of Care (Signed)
Immediate Anesthesia Transfer of Care Note  Patient: Melinda Olsen  Procedure(s) Performed: HYSTERECTOMY TOTAL LAPAROSCOPIC BILATERAL SALPINGECTOMY (Bilateral ) CYSTOSCOPY  Patient Location: PACU  Anesthesia Type:General  Level of Consciousness: awake  Airway & Oxygen Therapy: Patient Spontanous Breathing  Post-op Assessment: Report given to RN  Post vital signs: stable  Last Vitals:  Vitals:   12/22/17 0905 12/22/17 0907  BP:  124/87  Pulse: 97 (!) 106  Resp: 17   Temp:  36.7 C  SpO2: 100% 100%    Last Pain:  Vitals:   12/22/17 0907  TempSrc: Skin  PainSc:          Complications: No apparent anesthesia complications

## 2017-12-23 LAB — SURGICAL PATHOLOGY

## 2018-01-05 ENCOUNTER — Ambulatory Visit (INDEPENDENT_AMBULATORY_CARE_PROVIDER_SITE_OTHER): Payer: Medicaid Other | Admitting: Obstetrics & Gynecology

## 2018-01-05 ENCOUNTER — Encounter: Payer: Self-pay | Admitting: Obstetrics & Gynecology

## 2018-01-05 VITALS — BP 120/80 | HR 104 | Ht 70.0 in | Wt 177.0 lb

## 2018-01-05 DIAGNOSIS — N809 Endometriosis, unspecified: Principal | ICD-10-CM

## 2018-01-05 DIAGNOSIS — G8929 Other chronic pain: Secondary | ICD-10-CM

## 2018-01-05 DIAGNOSIS — R102 Pelvic and perineal pain: Secondary | ICD-10-CM

## 2018-01-05 DIAGNOSIS — N8003 Adenomyosis of the uterus: Secondary | ICD-10-CM

## 2018-01-05 DIAGNOSIS — N800 Endometriosis of uterus: Secondary | ICD-10-CM

## 2018-01-05 MED ORDER — OXYCODONE-ACETAMINOPHEN 5-325 MG PO TABS: 1.0000 | ORAL_TABLET | ORAL | 0 refills | Status: DC | PRN

## 2018-01-05 NOTE — Progress Notes (Signed)
  Postoperative Follow-up [facility]RONIC PELVIC PAIN, 2 weeks ago. Images:    Pathology: DIAGNOSIS:  A. UTERUS WITH CERVIX AND BILATERAL FALLOPIAN TUBES; HYSTERECTOMY WITH  BILATERAL SALPINGECTOMY:  - CERVIX WITHIN NORMAL LIMITS.  - BENIGN ENDOMETRIAL POLYPS, UP TO 1.5 CM.  - BACKGROUND SECRETORY ENDOMETRIUM.  - BILATERAL FALLOPIAN TUBES WITH PARATUBAL CYSTS.  Subjective: Patient reports some improvement in her preop symptoms. Eating a regular diet without difficulty. Pain is not well controlled.  Medications being used: ibuprofen (OTC).  Activity: normal activities of daily living. Patient reports vaginal sx's of Irregular bleeding  Objective: BP 120/80   Pulse (!) 104   Ht 5\' 10"  (1.778 m)   Wt 177 lb (80.3 kg)   BMI 25.40 kg/m  Physical Exam  Constitutional: She is oriented to person, place, and time. She appears well-developed and well-nourished. No distress.  Cardiovascular: Normal rate.  Pulmonary/Chest: Effort normal.  Abdominal: Soft. She exhibits no distension. There is no tenderness.  Incision Healing Well   Musculoskeletal: Normal range of motion.  Neurological: She is alert and oriented to person, place, and time. No cranial nerve deficit.  Skin: Skin is warm and dry.  Psychiatric: She has a normal mood and affect.   Assessment: s/p :  total laparoscopic hysterectomy with bilateral salpingectomy stable  Plan: Patient has done well after surgery with no apparent complications.  I have discussed the post-operative course to date, and the expected progress moving forward.  The patient understands what complications to be concerned about.  I will see the patient in routine follow up, or sooner if needed.    Activity plan: No heavy lifting. Pelvic rest. Percocet and IBF for pain  Melinda LibraRobert Paul Joshue Olsen 01/05/2018, 10:19 AM

## 2018-01-18 ENCOUNTER — Other Ambulatory Visit: Payer: Self-pay

## 2018-01-18 ENCOUNTER — Emergency Department
Admission: EM | Admit: 2018-01-18 | Discharge: 2018-01-18 | Disposition: A | Discharge status: Home / Self Care | Payer: Medicaid Other | Attending: Emergency Medicine | Admitting: Emergency Medicine

## 2018-01-18 DIAGNOSIS — J029 Acute pharyngitis, unspecified: Secondary | ICD-10-CM | POA: Diagnosis not present

## 2018-01-18 DIAGNOSIS — B9789 Other viral agents as the cause of diseases classified elsewhere: Secondary | ICD-10-CM | POA: Diagnosis not present

## 2018-01-18 DIAGNOSIS — F17210 Nicotine dependence, cigarettes, uncomplicated: Secondary | ICD-10-CM | POA: Diagnosis not present

## 2018-01-18 MED ORDER — FLUTICASONE PROPIONATE 50 MCG/ACT NA SUSP: 2.0000 | Freq: Every day | NASAL | 0 refills | Status: DC

## 2018-01-18 NOTE — Discharge Instructions (Signed)
[facility] if any [facility]  Increase fluids.  Tylenol if needed for throat pain.  Begin using Flonase nasal spray 2 sprays each nostril once a day.  Obtain Sudafed over-the-counter as needed for congestion and drainage.

## 2018-01-18 NOTE — ED Notes (Signed)
First Nurse Note: Pt ambulatory to desk without difficulty. Pt c/o of sore throat. Pt speaking in a whisper. Airway patent, able to hold secretions.

## 2018-01-18 NOTE — ED Triage Notes (Signed)
Sore throat x 3 days. Pain with swallowing and talking. Pt alert and oriented X4, active, cooperative, pt in NAD. RR even and unlabored, color WNL.

## 2018-01-18 NOTE — ED Provider Notes (Signed)
[facility] Emergency Department Provider Note  ___________________________________________   First MD Initiated Contact with Patient 01/18/18 [id number]     (approximate)  I have reviewed the triage vital signs and the nursing notes.   HISTORY  Chief Complaint Sore Throat   HPI Melinda Olsen is a 42 y.o. female is here with complaint of sore throat for the last 3 days.  Patient states that she has pain with swallowing and with talking.  She is also had some congestion and has been taking Percocet from her recent surgery for her throat pain.  She denies any fever or chills.  She continues to smoke cigarettes.  She rates her pain as 7 out of 10.   Past Medical History:  Diagnosis Date  . Anxiety    NO MEDS  . Asthma    WELL CONTROLLED  . Depression    NO MEDS  . Dyspnea    PT STATES CHRONIC SOB (SMOKER) BUT STATES SHE CAN WALK A MILE WITHOUT GETTING SOB OR HAVING CHEST PAIN  . Endometriosis   . Family history of adverse reaction to anesthesia    DAD-CAN HEAR SURGEONS TALKING DURING SURGERY BUT NEVER FEELS ANYTHING  . Seizures (HCC) 2016   X2 -NO MEDS    Patient Active Problem List   Diagnosis Date Noted  . Adenomyosis 12/01/2017  . Dysmenorrhea 11/09/2017  . Galactorrhea 11/09/2017  . Chronic pelvic pain in female 11/09/2017    Past Surgical History:  Procedure Laterality Date  . CESAREAN SECTION     X2  . CYSTOSCOPY  12/22/2017   Procedure: CYSTOSCOPY;  Surgeon: Nadara MustardHarris, Robert P, MD;  Location: [facility] ORS;  Service: Gynecology;;  . LAPAROSCOPIC HYSTERECTOMY Bilateral 12/22/2017   Procedure: HYSTERECTOMY TOTAL LAPAROSCOPIC BILATERAL SALPINGECTOMY;  Surgeon: Nadara MustardHarris, Robert P, MD;  Location: [facility] ORS;  Service: Gynecology;  Laterality: Bilateral;  . TUBAL LIGATION      Prior to Admission medications   Medication Sig Start Date End Date Taking? Authorizing Provider  albuterol (PROVENTIL HFA;VENTOLIN HFA) 108 (90 Base) MCG/ACT inhaler Inhale 2  puffs into the lungs every 4 (four) hours as needed for wheezing or shortness of breath. 03/24/17   Irean HongSung, Jade J, MD  fluticasone (FLONASE) 50 MCG/ACT nasal spray Place 2 sprays into both nostrils daily. 01/18/18 01/18/19  Tommi RumpsSummers, Shilynn Hoch L, PA-C  ibuprofen (ADVIL,MOTRIN) 200 MG tablet Take 1,000 mg by mouth every 8 (eight) hours as needed (FOR PAIN.).     [provider]  oxyCODONE-acetaminophen (PERCOCET) 5-325 MG tablet Take 1 tablet by mouth every 4 (four) hours as needed for moderate pain or severe pain. 01/05/18   Nadara MustardHarris, Robert P, MD    Allergies Patient has no known allergies.  Family History  Problem Relation Age of Onset  . Breast cancer Mother   . Cancer Paternal Aunt   . Cancer Maternal Grandfather   . Cancer Paternal Aunt     Social History Social History   Tobacco Use  . Smoking status: Current Every Day Smoker    Packs/day: 1.50    Years: 21.00    Pack years: 31.50    Types: Cigarettes  . Smokeless tobacco: Never Used  Substance Use Topics  . Alcohol use: Yes    Comment: 3 TIMES WEEKLY  . Drug use: No    Review of Systems Constitutional: No fever/chills Eyes: No visual changes. ENT: Positive sore throat. Cardiovascular: Denies chest pain. Respiratory: Denies shortness of breath. Skin: Negative for rash. Neurological: Negative for headaches  ___________________________________________   PHYSICAL EXAM:  VITAL SIGNS: ED Triage Vitals [01/18/18 0808]  Enc Vitals Group     BP 110/73     Pulse Rate 78     Resp 18     Temp 98.5 F (36.9 C)     Temp Source Oral     SpO2 98 %     Weight 174 lb (78.9 kg)     Height 5\' 7"  (1.702 m)     Head Circumference      Peak Flow      Pain Score 7     Pain Loc      Pain Edu?      Excl. in GC?    Constitutional: Alert and oriented. Well appearing and in no acute distress. Eyes: Conjunctivae are normal.  Head: Atraumatic. Nose: Mild congestion/rhinnorhea.  TMs are dull bilaterally. Mouth/Throat: Mucous  membranes are moist.  Oropharynx non-erythematous.  No exudate or tonsillar enlargement.  Uvula is midline.  There is minimal cobblestoning appreciated. Neck: No stridor.   Hematological/Lymphatic/Immunilogical: No cervical lymphadenopathy. Cardiovascular: Normal rate, regular rhythm. Grossly normal heart sounds.  Good peripheral circulation. Respiratory: Normal respiratory effort.  No retractions. Lungs CTAB. Musculoskeletal: Moves upper and lower extremities without any difficulty.  Normal gait was noted. Neurologic:  Normal speech and language. No gross focal neurologic deficits are appreciated.  Skin:  Skin is warm, dry and intact. No rash noted. Psychiatric: Mood and affect are normal. Speech and behavior are normal.  ____________________________________________   LABS (all labs ordered are listed, but only abnormal results are displayed)  Labs Reviewed - No data to display  PROCEDURES  Procedure(s) performed: None  Procedures  Critical Care performed: No  ____________________________________________   INITIAL IMPRESSION / ASSESSMENT AND PLAN / ED COURSE Patient was given prescription for Flonase nasal spray 2 sprays each nostril once daily.  Patient is to follow-up with [facility] if any continued problems.  Increase fluids.  Also obtain Sudafed over-the-counter as needed for congestion and drainage.  Patient may also take Tylenol over-the-counter as needed for throat pain.  ____________________________________________   FINAL CLINICAL IMPRESSION(S) / ED DIAGNOSES  Final diagnoses:  Viral pharyngitis     ED Discharge Orders        Ordered    fluticasone (FLONASE) 50 MCG/ACT nasal spray  Daily     01/18/18 0838       Note:  This document was prepared using Dragon voice recognition software and may include unintentional dictation errors.    Tommi Rumps, PA-C 01/18/18 1503    Governor Rooks, MD 01/18/18 [phone]

## 2018-02-09 ENCOUNTER — Ambulatory Visit: Payer: Medicaid Other | Admitting: Obstetrics & Gynecology

## 2018-08-19 IMAGING — US US BREAST*L* LIMITED INC AXILLA
1 series · 14 of 20 positions shown · non-contrast
Comparison: None.

CLINICAL DATA: Discharge from the left nipple, acute onset. Initial
encounter.

EXAM:
ULTRASOUND OF THE LEFT BREAST

[Series 1: us breast*left* limited inc axilla · 0.07mm/px · 14 of 20 slices shown]
[im 1/20]
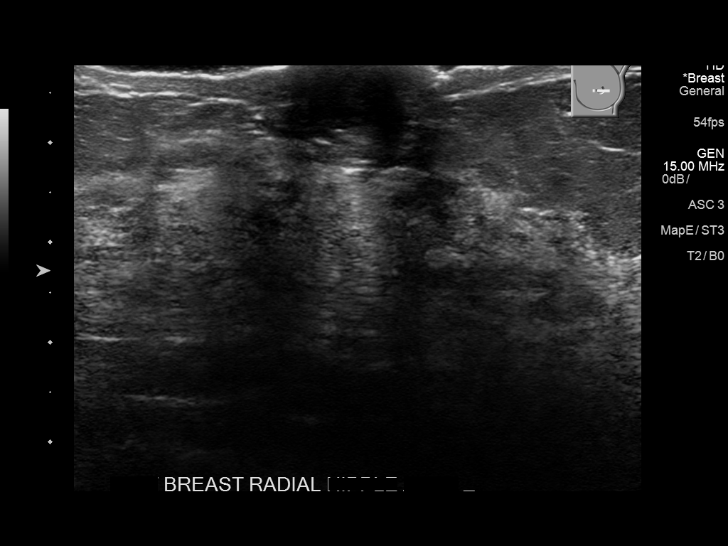
[im 3/20]
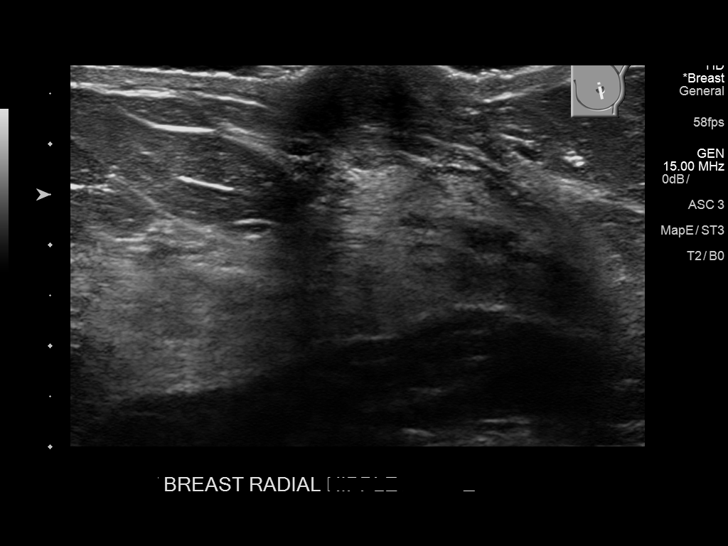
[im 4/20]
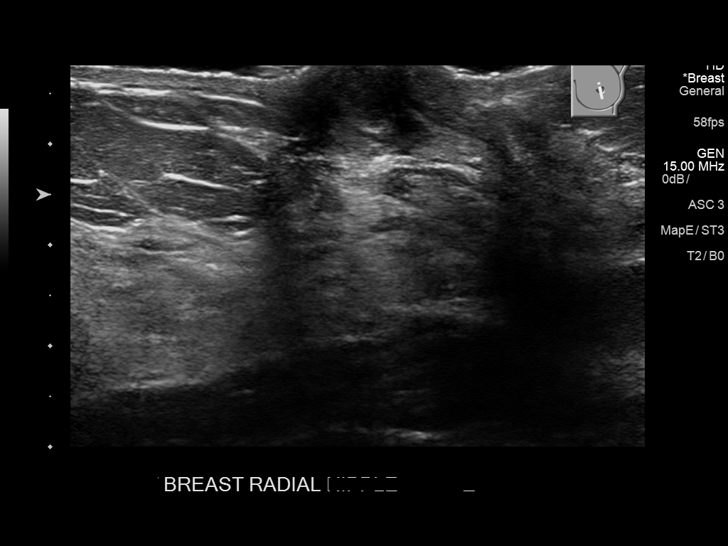
[im 6/20]
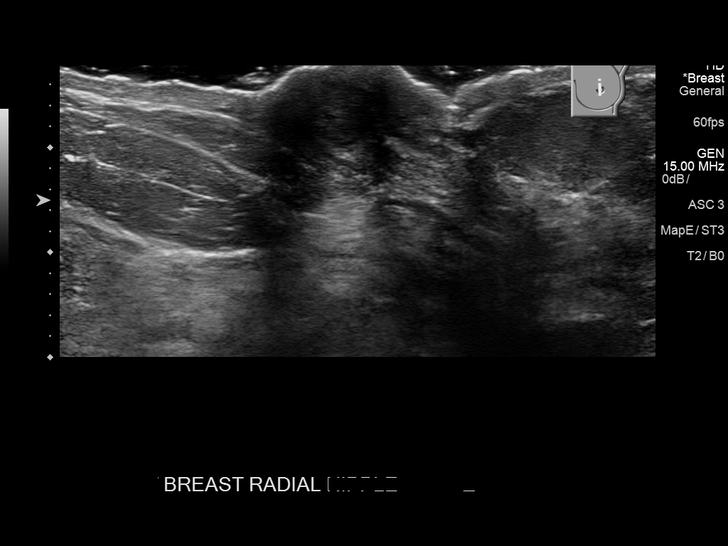
[im 7/20]
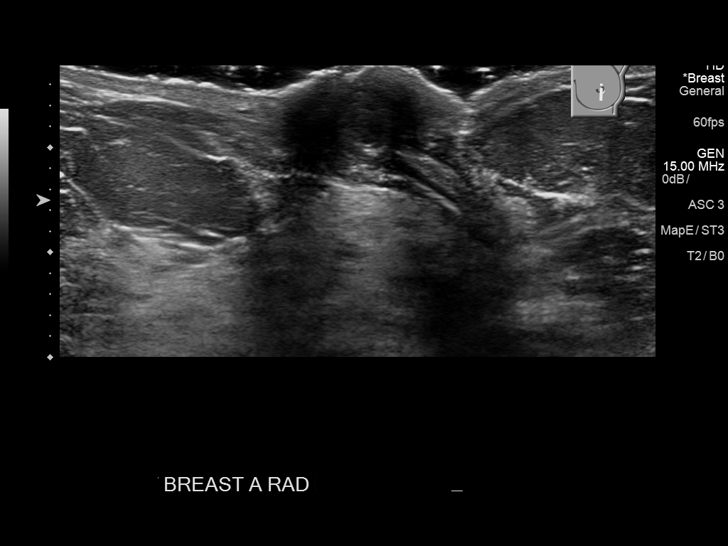
[im 8/20]
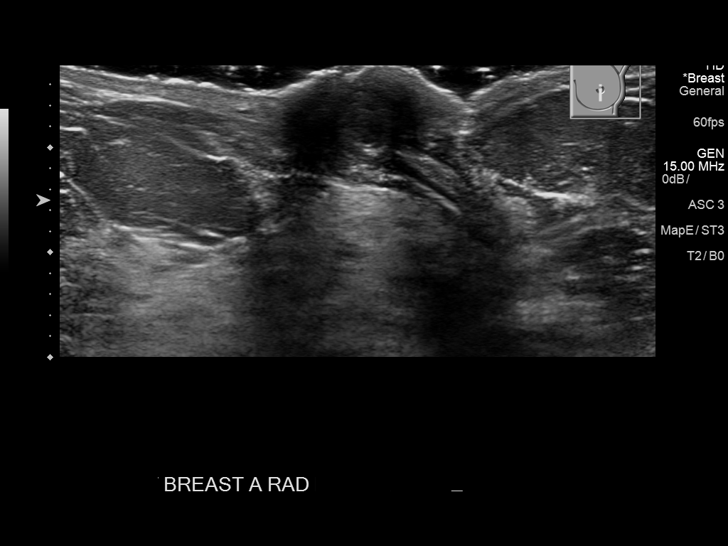
[im 10/20]
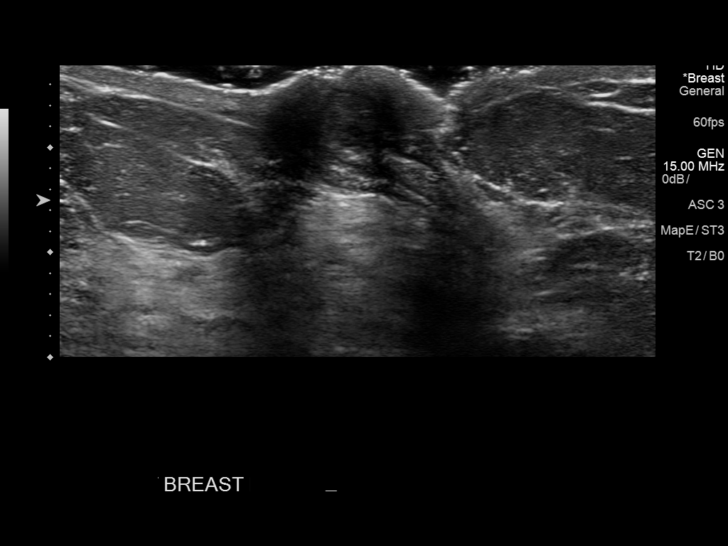
[im 11/20]
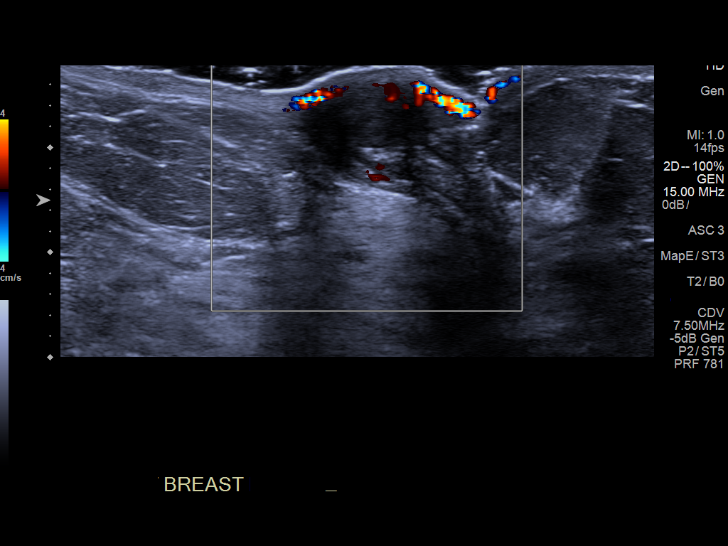
[im 13/20]
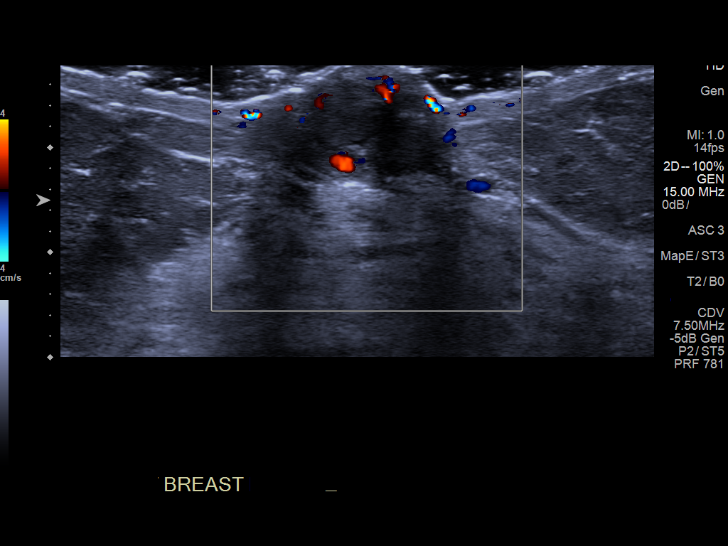
[im 14/20]
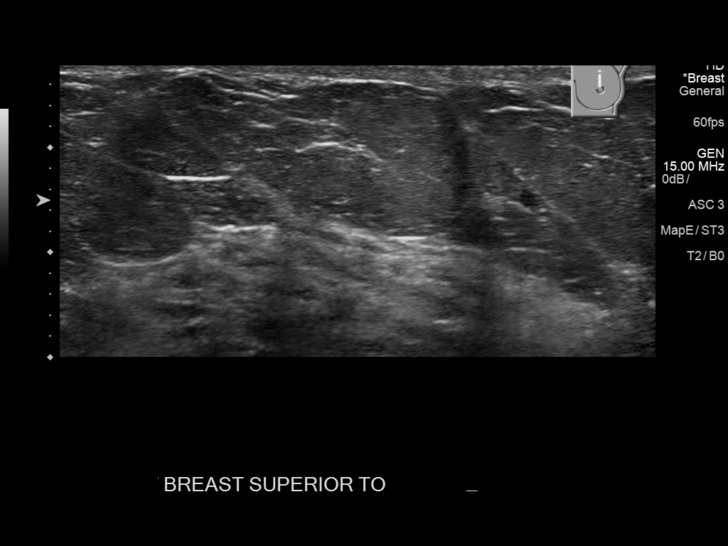
[im 16/20]
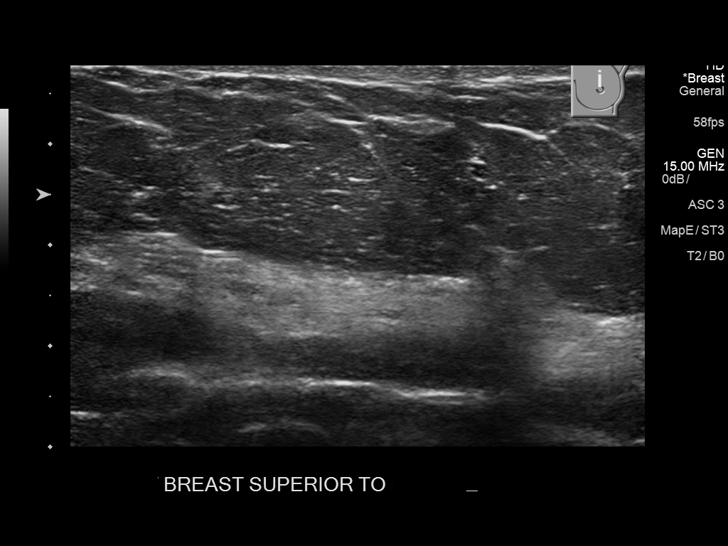
[im 17/20]
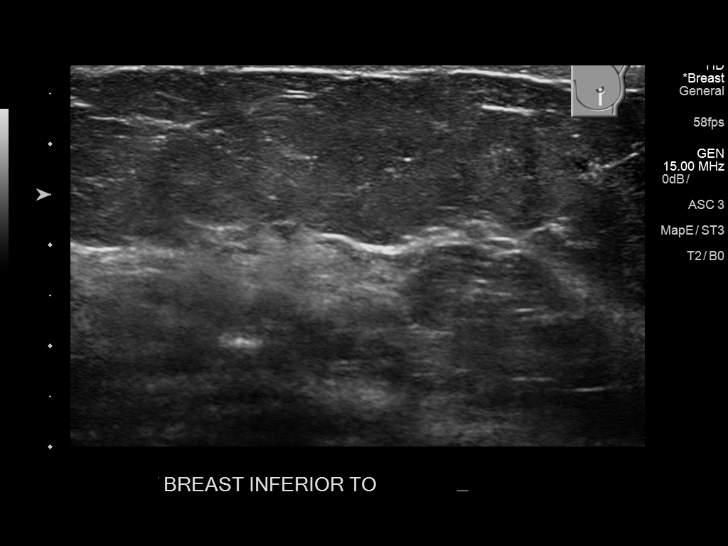
[im 18/20]
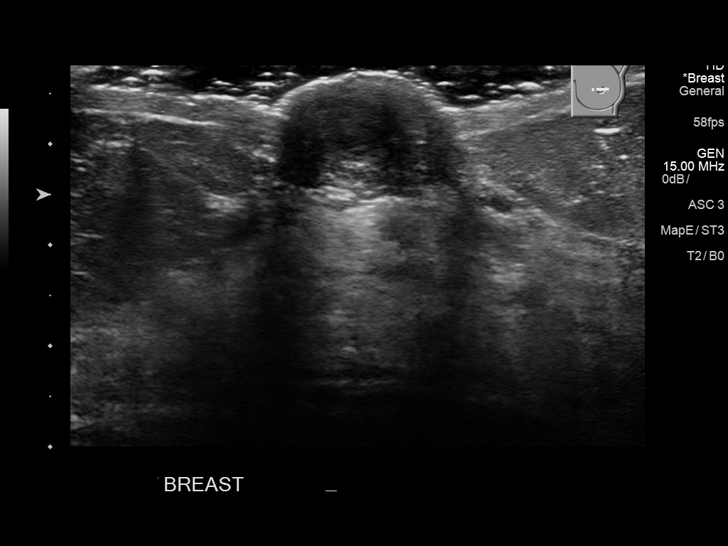
[im 20/20]
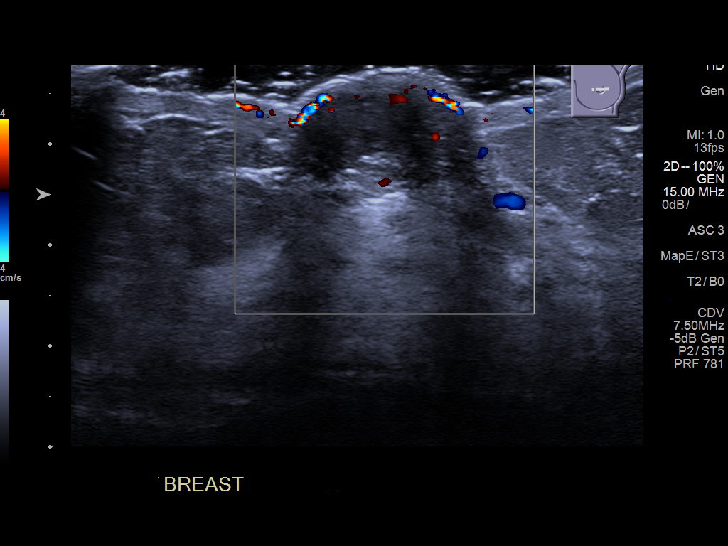

[14 of 20 positions shown; findings below may reference images not displayed]

FINDINGS: On physical exam, no focal abnormality is seen.

Targeted ultrasound is performed, showing no focal abnormality
underlying the left nipple. There is no evidence of abscess or mass.
IMPRESSION: Unremarkable limited left breast ultrasound. Left areola is
unremarkable in appearance. No evidence of abscess or mass.

BI-RADS CATEGORY  1

## 2018-11-23 ENCOUNTER — Emergency Department
Admission: EM | Admit: 2018-11-23 | Discharge: 2018-11-23 | Disposition: A | Discharge status: Home / Self Care | Payer: Medicaid Other | Attending: Emergency Medicine | Admitting: Emergency Medicine

## 2018-11-23 ENCOUNTER — Encounter: Payer: Self-pay | Admitting: Emergency Medicine

## 2018-11-23 ENCOUNTER — Emergency Department: Payer: Medicaid Other

## 2018-11-23 DIAGNOSIS — Z79899 Other long term (current) drug therapy: Secondary | ICD-10-CM | POA: Diagnosis not present

## 2018-11-23 DIAGNOSIS — M5412 Radiculopathy, cervical region: Secondary | ICD-10-CM | POA: Diagnosis not present

## 2018-11-23 DIAGNOSIS — F17210 Nicotine dependence, cigarettes, uncomplicated: Secondary | ICD-10-CM | POA: Insufficient documentation

## 2018-11-23 DIAGNOSIS — R05 Cough: Secondary | ICD-10-CM | POA: Diagnosis not present

## 2018-11-23 DIAGNOSIS — R0602 Shortness of breath: Secondary | ICD-10-CM | POA: Diagnosis not present

## 2018-11-23 DIAGNOSIS — J209 Acute bronchitis, unspecified: Secondary | ICD-10-CM | POA: Insufficient documentation

## 2018-11-23 MED ORDER — MELOXICAM 15 MG PO TABS: 15.0000 mg | ORAL_TABLET | Freq: Every day | ORAL | 0 refills | Status: DC

## 2018-11-23 MED ORDER — ALBUTEROL SULFATE (2.5 MG/3ML) 0.083% IN NEBU
5.0000 mg | INHALATION_SOLUTION | Freq: Once | RESPIRATORY_TRACT | Status: AC
  Administered 2018-11-23: 5 mg via RESPIRATORY_TRACT
  Filled 2018-11-23: qty 6

## 2018-11-23 MED ORDER — ALBUTEROL SULFATE (2.5 MG/3ML) 0.083% IN NEBU: 2.5000 mg | INHALATION_SOLUTION | Freq: Once | RESPIRATORY_TRACT | Status: DC

## 2018-11-23 MED ORDER — DEXAMETHASONE SODIUM PHOSPHATE 10 MG/ML IJ SOLN
10.0000 mg | Freq: Once | INTRAMUSCULAR | Status: AC
  Administered 2018-11-23: 10 mg via INTRAMUSCULAR
  Filled 2018-11-23: qty 1

## 2018-11-23 MED ORDER — PREDNISONE 50 MG PO TABS: 50.0000 mg | ORAL_TABLET | Freq: Every day | ORAL | 0 refills | Status: DC

## 2018-11-23 MED ORDER — ALBUTEROL SULFATE HFA 108 (90 BASE) MCG/ACT IN AERS: 2.0000 | INHALATION_SPRAY | RESPIRATORY_TRACT | 0 refills | Status: DC | PRN

## 2018-11-23 NOTE — ED Provider Notes (Signed)
[facility] Emergency Department Provider Note  ____________________________________________  Time seen: Approximately 6:40 PM  I have reviewed the triage vital signs and the nursing notes.   HISTORY  Chief Complaint Cough    HPI Melinda Olsen is a 43 y.o. female who presents the emergency department complaining of 2 separate complaints.  First complaint is primary complaint.  Patient reports that over the past week she has had some nasal congestion, coughing, subjective fever.  Patient reports that she has been using her albuterol as she has a history of asthma, however is a year out of date.  Patient reports that symptoms have not drastically worsened, but after a week of symptoms she is concerned that she has bronchitis.  Patient does have a history of bronchitis.  No history of intubation for asthma exacerbation.  Patient has been taking her out of date asthma inhaler, Motrin, DayQuil with no significant relief.  Patient is also asking to be evaluated for radicular symptoms in the bilateral upper extremity.  Patient reports that symptoms have been going on times several months.  No trauma precipitated symptoms.  Patient reports that it comes and goes.  She does not take any medication on a regular basis for this complaint.  Patient denies any headache, neck pain, chest pain, shortness of breath, abdominal pain, nausea vomiting, diarrhea or constipation.    Past Medical History:  Diagnosis Date  . Anxiety    NO MEDS  . Asthma    WELL CONTROLLED  . Depression    NO MEDS  . Dyspnea    PT STATES CHRONIC SOB (SMOKER) BUT STATES SHE CAN WALK A MILE WITHOUT GETTING SOB OR HAVING CHEST PAIN  . Endometriosis   . Family history of adverse reaction to anesthesia    DAD-CAN HEAR SURGEONS TALKING DURING SURGERY BUT NEVER FEELS ANYTHING  . Seizures (HCC) 2016   X2 -NO MEDS    Patient Active Problem List   Diagnosis Date Noted  . Adenomyosis 12/01/2017  .  Dysmenorrhea 11/09/2017  . Galactorrhea 11/09/2017  . Chronic pelvic pain in female 11/09/2017    Past Surgical History:  Procedure Laterality Date  . CESAREAN SECTION     X2  . CYSTOSCOPY  12/22/2017   Procedure: CYSTOSCOPY;  Surgeon: Nadara MustardHarris, Robert P, MD;  Location: [facility] ORS;  Service: Gynecology;;  . LAPAROSCOPIC HYSTERECTOMY Bilateral 12/22/2017   Procedure: HYSTERECTOMY TOTAL LAPAROSCOPIC BILATERAL SALPINGECTOMY;  Surgeon: Nadara MustardHarris, Robert P, MD;  Location: [facility] ORS;  Service: Gynecology;  Laterality: Bilateral;  . TUBAL LIGATION      Prior to Admission medications   Medication Sig Start Date End Date Taking? Authorizing Provider  albuterol (PROVENTIL HFA;VENTOLIN HFA) 108 (90 Base) MCG/ACT inhaler Inhale 2 puffs into the lungs every 4 (four) hours as needed for wheezing or shortness of breath. 11/23/18   Rutger Salton, Delorise RoyalsJonathan D, PA-C  fluticasone (FLONASE) 50 MCG/ACT nasal spray Place 2 sprays into both nostrils daily. 01/18/18 01/18/19  Tommi RumpsSummers, Rhonda L, PA-C  ibuprofen (ADVIL,MOTRIN) 200 MG tablet Take 1,000 mg by mouth every 8 (eight) hours as needed (FOR PAIN.).     [provider]  meloxicam (MOBIC) 15 MG tablet Take 1 tablet (15 mg total) by mouth daily. 11/23/18   Marshawn Normoyle, Delorise RoyalsJonathan D, PA-C  oxyCODONE-acetaminophen (PERCOCET) 5-325 MG tablet Take 1 tablet by mouth every 4 (four) hours as needed for moderate pain or severe pain. 01/05/18   Nadara MustardHarris, Robert P, MD  predniSONE (DELTASONE) 50 MG tablet Take 1 tablet (50 mg  total) by mouth daily with breakfast. 11/23/18   Charlaine Utsey, Delorise Royals, PA-C    Allergies Patient has no known allergies.  Family History  Problem Relation Age of Onset  . Breast cancer Mother   . Cancer Paternal Aunt   . Cancer Maternal Grandfather   . Cancer Paternal Aunt     Social History Social History   Tobacco Use  . Smoking status: Current Every Day Smoker    Packs/day: 1.50    Years: 22.00    Pack years: 33.00    Types: Cigarettes  .  Smokeless tobacco: Never Used  Substance Use Topics  . Alcohol use: Yes    Comment: 3 TIMES WEEKLY  . Drug use: No     Review of Systems  Constitutional: Subjective fever/chills Eyes: No visual changes. No discharge ENT: Mild nasal congestion Cardiovascular: no chest pain. Respiratory: Positive cough. No SOB. Gastrointestinal: No abdominal pain.  No nausea, no vomiting.  No diarrhea.  No constipation. Musculoskeletal: Positive for bilateral mild radicular symptoms in the upper extremities.. Skin: Negative for rash, abrasions, lacerations, ecchymosis. Neurological: Negative for headaches, focal weakness or numbness. 10-point ROS otherwise negative.  ____________________________________________   PHYSICAL EXAM:  VITAL SIGNS: ED Triage Vitals  Enc Vitals Group     BP 11/23/18 1801 118/84     Pulse Rate 11/23/18 1801 81     Resp 11/23/18 1801 20     Temp 11/23/18 1801 98.2 F (36.8 C)     Temp Source 11/23/18 1801 Oral     SpO2 11/23/18 1801 99 %     Weight 11/23/18 1804 165 lb (74.8 kg)     Height 11/23/18 1804 5\' 6"  (1.676 m)     Head Circumference --      Peak Flow --      Pain Score 11/23/18 1803 5     Pain Loc --      Pain Edu? --      Excl. in GC? --      Constitutional: Alert and oriented. Well appearing and in no acute distress. Eyes: Conjunctivae are normal. PERRL. EOMI. Head: Atraumatic. ENT:      Ears: EACs and TMs unremarkable bilaterally.      Nose: Minimal clear congestion/rhinnorhea.      Mouth/Throat: Mucous membranes are moist.  Oropharynx is nonerythematous and nonedematous.  Uvula is midline. Neck: No stridor.  No midline cervical spine tenderness to palpation.  No tenderness to palpation of bilateral paraspinal muscle groups.  Radial pulse intact bilateral upper extremities.  Sensation intact and equal in all dermatomal distributions bilateral upper extremities. Cardiovascular: Normal rate, regular rhythm. Normal S1 and S2.  Good peripheral  circulation. Respiratory: Normal respiratory effort without tachypnea or retractions. Lungs with few scattered expiratory wheezes in the bilateral lower lung fields.Peri Jefferson air entry to the bases with no decreased or absent breath sounds. Musculoskeletal: Full range of motion to all extremities. No gross deformities appreciated. Neurologic:  Normal speech and language. No gross focal neurologic deficits are appreciated.  Skin:  Skin is warm, dry and intact. No rash noted. Psychiatric: Mood and affect are normal. Speech and behavior are normal. Patient exhibits appropriate insight and judgement.   ____________________________________________   LABS (all labs ordered are listed, but only abnormal results are displayed)  Labs Reviewed - No data to display ____________________________________________  EKG   ____________________________________________  RADIOLOGY I personally viewed and evaluated these images as part of my medical decision making, as well as reviewing the written report by  the radiologist.  Dg Chest 2 View  Result Date: 11/23/2018 CLINICAL DATA:  Acute cough and shortness of breath. EXAM: CHEST - 2 VIEW COMPARISON:  None. FINDINGS: The cardiomediastinal silhouette is unremarkable. There is no evidence of focal airspace disease, pulmonary edema, suspicious pulmonary nodule/mass, pleural effusion, or pneumothorax. No acute bony abnormalities are identified. IMPRESSION: No active cardiopulmonary disease. Electronically Signed   By: Harmon PierJeffrey  Hu M.D.   On: 11/23/2018 18:26    ____________________________________________    PROCEDURES  Procedure(s) performed:    Procedures    Medications  albuterol (PROVENTIL) (2.5 MG/3ML) 0.083% nebulizer solution 5 mg (5 mg Nebulization Given 11/23/18 1906)  dexamethasone (DECADRON) injection 10 mg (10 mg Intramuscular Given 11/23/18 1906)     ____________________________________________   INITIAL IMPRESSION / ASSESSMENT AND  PLAN / ED COURSE  Pertinent labs & imaging results that were available during my care of the patient were reviewed by me and considered in my medical decision making (see chart for details).  Review of the [place] CSRS was performed in accordance of the [facility] prior to dispensing any controlled drugs.      Patient's diagnosis is consistent with bronchitis, cervical radiculopathy.  Patient presents the emergency department complaining of cough x1 week and several months worth of radicular symptoms in bilateral upper extremities.  Overall, exam was reassuring.  Chest x-ray reveals no consolidation concerning for pneumonia.  Patient is given Decadron and albuterol in the emergency department.. Patient will be discharged home with prescriptions for albuterol, prednisone, meloxicam. Patient is to follow up with primary care as needed or otherwise directed. Patient is given ED precautions to return to the ED for any worsening or new symptoms.     ____________________________________________  FINAL CLINICAL IMPRESSION(S) / ED DIAGNOSES  Final diagnoses:  Acute bronchitis, unspecified organism  Cervical radiculopathy      NEW MEDICATIONS STARTED DURING THIS VISIT:  ED Discharge Orders         Ordered    albuterol (PROVENTIL HFA;VENTOLIN HFA) 108 (90 Base) MCG/ACT inhaler  Every 4 hours PRN     11/23/18 1906    predniSONE (DELTASONE) 50 MG tablet  Daily with breakfast     11/23/18 1906    meloxicam (MOBIC) 15 MG tablet  Daily     11/23/18 1906              This chart was dictated using voice recognition software/Dragon. Despite best efforts to proofread, errors can occur which can change the meaning. Any change was purely unintentional.    Racheal PatchesCuthriell, Zakaree Mcclenahan D, PA-C 11/23/18 1907    Myrna BlazerSchaevitz, David Matthew, MD 11/23/18 [phone]

## 2018-11-23 NOTE — ED Triage Notes (Signed)
Pt stating she is coming in today for SOB, cough and intermittent tingling in bilateral hands. Pt stating sx for the last week. Pt is unsure of fevers. Pt stating she is coughing up yellow. Pt stating some pain in her left posterior rib cage. Pt in NAD

## 2018-11-23 NOTE — ED Notes (Signed)
FIRST NURSE NOTE: Pt ambulate with a steady gait, no distress noted. Breathing unlabored and regular

## 2018-11-23 NOTE — ED Notes (Signed)
Patient transported to X-ray 

## 2019-02-08 ENCOUNTER — Ambulatory Visit: Payer: Self-pay | Admitting: Family Medicine

## 2019-02-25 ENCOUNTER — Other Ambulatory Visit: Payer: Self-pay

## 2019-02-25 ENCOUNTER — Emergency Department
Admission: EM | Admit: 2019-02-25 | Discharge: 2019-02-25 | Disposition: A | Discharge status: Home / Self Care | Payer: Medicaid Other | Attending: Emergency Medicine | Admitting: Emergency Medicine

## 2019-02-25 ENCOUNTER — Encounter: Payer: Self-pay | Admitting: Emergency Medicine

## 2019-02-25 DIAGNOSIS — Z79899 Other long term (current) drug therapy: Secondary | ICD-10-CM | POA: Diagnosis not present

## 2019-02-25 DIAGNOSIS — M545 Low back pain: Secondary | ICD-10-CM | POA: Diagnosis present

## 2019-02-25 DIAGNOSIS — M5442 Lumbago with sciatica, left side: Secondary | ICD-10-CM | POA: Diagnosis not present

## 2019-02-25 DIAGNOSIS — F17210 Nicotine dependence, cigarettes, uncomplicated: Secondary | ICD-10-CM | POA: Diagnosis not present

## 2019-02-25 LAB — URINALYSIS, COMPLETE (UACMP) WITH MICROSCOPIC
Bilirubin Urine: NEGATIVE
Glucose, UA: NEGATIVE mg/dL
Hgb urine dipstick: NEGATIVE
Ketones, ur: NEGATIVE mg/dL
Leukocytes,Ua: NEGATIVE
Nitrite: NEGATIVE
Protein, ur: NEGATIVE mg/dL
Specific Gravity, Urine: 1.018 (ref 1.005–1.030)
pH: 6 (ref 5.0–8.0)

## 2019-02-25 LAB — POCT PREGNANCY, URINE: Preg Test, Ur: NEGATIVE

## 2019-02-25 MED ORDER — PREDNISONE 10 MG (21) PO TBPK: ORAL_TABLET | ORAL | 0 refills | Status: DC

## 2019-02-25 MED ORDER — METHOCARBAMOL 500 MG PO TABS: 500.0000 mg | ORAL_TABLET | Freq: Three times a day (TID) | ORAL | 0 refills | Status: AC | PRN

## 2019-02-25 NOTE — ED Triage Notes (Signed)
Pt here for lower back pain.  Pain in lower back and into left hip. Pain with ambulation. No loss bowel or bladder. Hurt cleaning out trunk yesterday and seemed to be getting better but then hurt again today while working with microwave. Ambulatory. NAD

## 2019-02-25 NOTE — ED Provider Notes (Signed)
[facility] Emergency Department Provider Note  ____________________________________________  Time seen: Approximately 5:27 PM  I have reviewed the triage vital signs and the nursing notes.   HISTORY  Chief Complaint Back Pain    HPI Melinda Olsen is a 43 y.o. female presents to the emergency department with 8 out of 10 back pain with left lower extremity radiculopathy that has occurred on and off for the past week.  Patient reports that symptoms originally started when she bent over to pick up something out of her trunk.  Patient reports that she took some meloxicam and pain improved some but returned when she bent over to clean out the microwave.  She denies bowel or bladder incontinence or saddle anesthesia.  She denies subjective weakness.  She has been able to ambulate without difficulty.  No other alleviating measures have been attempted.        Past Medical History:  Diagnosis Date  . Anxiety    NO MEDS  . Asthma    WELL CONTROLLED  . Depression    NO MEDS  . Dyspnea    PT STATES CHRONIC SOB (SMOKER) BUT STATES SHE CAN WALK A MILE WITHOUT GETTING SOB OR HAVING CHEST PAIN  . Endometriosis   . Family history of adverse reaction to anesthesia    DAD-CAN HEAR SURGEONS TALKING DURING SURGERY BUT NEVER FEELS ANYTHING  . Seizures (HCC) 2016   X2 -NO MEDS    Patient Active Problem List   Diagnosis Date Noted  . Adenomyosis 12/01/2017  . Dysmenorrhea 11/09/2017  . Galactorrhea 11/09/2017  . Chronic pelvic pain in female 11/09/2017    Past Surgical History:  Procedure Laterality Date  . CESAREAN SECTION     X2  . CYSTOSCOPY  12/22/2017   Procedure: CYSTOSCOPY;  Surgeon: Nadara MustardHarris, Robert P, MD;  Location: [facility] ORS;  Service: Gynecology;;  . LAPAROSCOPIC HYSTERECTOMY Bilateral 12/22/2017   Procedure: HYSTERECTOMY TOTAL LAPAROSCOPIC BILATERAL SALPINGECTOMY;  Surgeon: Nadara MustardHarris, Robert P, MD;  Location: [facility] ORS;  Service: Gynecology;  Laterality:  Bilateral;  . TUBAL LIGATION      Prior to Admission medications   Medication Sig Start Date End Date Taking? Authorizing Provider  albuterol (PROVENTIL HFA;VENTOLIN HFA) 108 (90 Base) MCG/ACT inhaler Inhale 2 puffs into the lungs every 4 (four) hours as needed for wheezing or shortness of breath. 11/23/18   Cuthriell, Delorise RoyalsJonathan D, PA-C  fluticasone (FLONASE) 50 MCG/ACT nasal spray Place 2 sprays into both nostrils daily. 01/18/18 01/18/19  Tommi RumpsSummers, Rhonda L, PA-C  ibuprofen (ADVIL,MOTRIN) 200 MG tablet Take 1,000 mg by mouth every 8 (eight) hours as needed (FOR PAIN.).     [provider]  meloxicam (MOBIC) 15 MG tablet Take 1 tablet (15 mg total) by mouth daily. 11/23/18   Cuthriell, Delorise RoyalsJonathan D, PA-C  methocarbamol (ROBAXIN) 500 MG tablet Take 1 tablet (500 mg total) by mouth every 8 (eight) hours as needed for up to 5 days. 02/25/19 03/02/19  Orvil FeilWoods, Jaclyn M, PA-C  oxyCODONE-acetaminophen (PERCOCET) 5-325 MG tablet Take 1 tablet by mouth every 4 (four) hours as needed for moderate pain or severe pain. 01/05/18   Nadara MustardHarris, Robert P, MD  predniSONE (STERAPRED UNI-PAK 21 TAB) 10 MG (21) TBPK tablet Take 6 tabs the the 1st day. Take 6 tabs the the 2nd day. Take 5 tabs the the 3rd day. Take 5 tabs the 4th day. Take 4 tabs the the 5th day.Take 4 tabs the the 6th day.Take 3 tabs the 7th day.Take 3 tabs the  8th day. Take 2 tabs the 9th day. Take 2 tabs the 10th day. Take 1 tab the 11th day. Take 1 tab the 12th day. 02/25/19   Orvil Feil, PA-C    Allergies Patient has no known allergies.  Family History  Problem Relation Age of Onset  . Breast cancer Mother   . Cancer Paternal Aunt   . Cancer Maternal Grandfather   . Cancer Paternal Aunt     Social History Social History   Tobacco Use  . Smoking status: Current Every Day Smoker    Packs/day: 1.50    Years: 22.00    Pack years: 33.00    Types: Cigarettes  . Smokeless tobacco: Never Used  Substance Use Topics  . Alcohol use: Yes     Comment: 3 TIMES WEEKLY  . Drug use: No     Review of Systems  Constitutional: No fever/chills Eyes: No visual changes. No discharge ENT: No upper respiratory complaints. Cardiovascular: no chest pain. Respiratory: no cough. No SOB. Gastrointestinal: No abdominal pain.  No nausea, no vomiting.  No diarrhea.  No constipation. Genitourinary: Negative for dysuria. No hematuria Musculoskeletal: Patient has low back pain.  Skin: Negative for rash, abrasions, lacerations, ecchymosis. Neurological: Negative for headaches, focal weakness or numbness.   ____________________________________________   PHYSICAL EXAM:  VITAL SIGNS: ED Triage Vitals  Enc Vitals Group     BP 02/25/19 1453 139/87     Pulse Rate 02/25/19 1453 (!) 102     Resp 02/25/19 1453 18     Temp 02/25/19 1453 98.2 F (36.8 C)     Temp Source 02/25/19 1453 Oral     SpO2 02/25/19 1453 97 %     Weight 02/25/19 1449 180 lb (81.6 kg)     Height 02/25/19 1449 5\' 7"  (1.702 m)     Head Circumference --      Peak Flow --      Pain Score 02/25/19 1449 9     Pain Loc --      Pain Edu? --      Excl. in GC? --      Constitutional: Alert and oriented. Well appearing and in no acute distress. Eyes: Conjunctivae are normal. PERRL. EOMI. Head: Atraumatic. Cardiovascular: Normal rate, regular rhythm. Normal S1 and S2.  Good peripheral circulation. Respiratory: Normal respiratory effort without tachypnea or retractions. Lungs CTAB. Good air entry to the bases with no decreased or absent breath sounds. Gastrointestinal: Bowel sounds 4 quadrants. Soft and nontender to palpation. No guarding or rigidity. No palpable masses. No distention. No CVA tenderness. Musculoskeletal: Full range of motion to all extremities. No gross deformities appreciated.  Positive straight leg raise test, left.  Patient has paraspinal muscle tenderness along the lumbar spine bilaterally. Neurologic:  Normal speech and language. No gross focal neurologic  deficits are appreciated.  Skin:  Skin is warm, dry and intact. No rash noted. Psychiatric: Mood and affect are normal. Speech and behavior are normal. Patient exhibits appropriate insight and judgement.   ____________________________________________   LABS (all labs ordered are listed, but only abnormal results are displayed)  Labs Reviewed  URINALYSIS, COMPLETE (UACMP) WITH MICROSCOPIC - Abnormal; Notable for the following components:      Result Value   Color, Urine YELLOW (*)    APPearance HAZY (*)    Bacteria, UA RARE (*)    All other components within normal limits  POC URINE PREG, ED  POCT PREGNANCY, URINE   ____________________________________________  EKG   ____________________________________________  RADIOLOGY   No results found.  ____________________________________________    PROCEDURES  Procedure(s) performed:    Procedures    Medications - No data to display   ____________________________________________   INITIAL IMPRESSION / ASSESSMENT AND PLAN / ED COURSE  Pertinent labs & imaging results that were available during my care of the patient were reviewed by me and considered in my medical decision making (see chart for details).  Review of the [place] CSRS was performed in accordance of the [facility] prior to dispensing any controlled drugs.           Assessment and Plan:  Low back pain 42 year old female presents to the emergency department with low back pain that radiates down the posterior aspect of the left lower extremity.  On physical exam, patient had a positive straight leg raise test and paraspinal muscle tenderness along the lumbar spine.  Original differential diagnosis included cystitis, pregnancy, lumbar strain and sciatica.  Urinalysis was noncontributory for cystitis.  Urine pregnancy test was negative.  History and physical exam findings suggested sciatica.  Patient was discharged with tapered prednisone and Robaxin.  She was  advised to follow-up with primary care as needed.    ____________________________________________  FINAL CLINICAL IMPRESSION(S) / ED DIAGNOSES  Final diagnoses:  Acute left-sided low back pain with left-sided sciatica      NEW MEDICATIONS STARTED DURING THIS VISIT:  ED Discharge Orders         Ordered    predniSONE (STERAPRED UNI-PAK 21 TAB) 10 MG (21) TBPK tablet     02/25/19 1722    methocarbamol (ROBAXIN) 500 MG tablet  Every 8 hours PRN     02/25/19 1722              This chart was dictated using voice recognition software/Dragon. Despite best efforts to proofread, errors can occur which can change the meaning. Any change was purely unintentional.    Orvil Feil, PA-C 02/25/19 1731    Sharman Cheek, MD 02/26/19 [phone]

## 2019-02-28 ENCOUNTER — Encounter: Payer: Self-pay | Admitting: Intensive Care

## 2019-02-28 ENCOUNTER — Other Ambulatory Visit: Payer: Self-pay

## 2019-02-28 ENCOUNTER — Emergency Department: Payer: Medicaid Other

## 2019-02-28 ENCOUNTER — Emergency Department
Admission: EM | Admit: 2019-02-28 | Discharge: 2019-02-28 | Disposition: A | Discharge status: Home / Self Care | Payer: Medicaid Other | Attending: Emergency Medicine | Admitting: Emergency Medicine

## 2019-02-28 DIAGNOSIS — F17210 Nicotine dependence, cigarettes, uncomplicated: Secondary | ICD-10-CM | POA: Insufficient documentation

## 2019-02-28 DIAGNOSIS — M545 Low back pain: Secondary | ICD-10-CM | POA: Diagnosis not present

## 2019-02-28 DIAGNOSIS — Z79899 Other long term (current) drug therapy: Secondary | ICD-10-CM | POA: Insufficient documentation

## 2019-02-28 DIAGNOSIS — M5432 Sciatica, left side: Secondary | ICD-10-CM | POA: Insufficient documentation

## 2019-02-28 DIAGNOSIS — M5442 Lumbago with sciatica, left side: Secondary | ICD-10-CM | POA: Diagnosis not present

## 2019-02-28 DIAGNOSIS — M79605 Pain in left leg: Secondary | ICD-10-CM | POA: Diagnosis present

## 2019-02-28 LAB — URINALYSIS, COMPLETE (UACMP) WITH MICROSCOPIC
Bacteria, UA: NONE SEEN
Bilirubin Urine: NEGATIVE
Glucose, UA: NEGATIVE mg/dL
Hgb urine dipstick: NEGATIVE
Ketones, ur: NEGATIVE mg/dL
Leukocytes,Ua: NEGATIVE
Nitrite: NEGATIVE
Protein, ur: NEGATIVE mg/dL
Specific Gravity, Urine: 1.005 (ref 1.005–1.030)
pH: 6 (ref 5.0–8.0)

## 2019-02-28 MED ORDER — DICLOFENAC POTASSIUM 50 MG PO TABS: 50.0000 mg | ORAL_TABLET | Freq: Three times a day (TID) | ORAL | 0 refills | Status: DC

## 2019-02-28 MED ORDER — LIDOCAINE 5 % EX PTCH: 1.0000 | MEDICATED_PATCH | CUTANEOUS | 0 refills | Status: DC

## 2019-02-28 NOTE — ED Notes (Signed)
See triage note  Presents with cont's pain to lower back and is moving into left leg  Also having some pain to shoulder  Provider in room on pt's arrival

## 2019-02-28 NOTE — ED Triage Notes (Signed)
Patient c/o left sided lower back pain that radiates down left leg. Also c/o L shoulder soreness. Reports she was seen here Sunday for same. Patient drover herself to ER

## 2019-02-28 NOTE — ED Notes (Signed)
Patient transported to MRI 

## 2019-02-28 NOTE — ED Provider Notes (Signed)
[facility] Emergency Department Provider Note  ____________________________________________  Time seen: Approximately 11:57 AM  I have reviewed the triage vital signs and the nursing notes.   HISTORY  Chief Complaint Sciatica (left)    HPI Melinda Olsen is a 43 y.o. female that presents to the emergency department for evaluation of left low back pain that radiates down the back of left leg for just over 1 week. Yesterday, she had an episode of bowel incontinence. She woke up this morning and also noticed that her left shoulder was sore.  She was seen in this emergency department for symptoms 3 days ago and received a prescription for Robaxin and prednisone but was unable to pick her medications up that night.  She picked them up the following morning.  She took 50 mg of prednisone this today.  No saddle anesthesias.  No weakness.   Past Medical History:  Diagnosis Date  . Anxiety    NO MEDS  . Asthma    WELL CONTROLLED  . Depression    NO MEDS  . Dyspnea    PT STATES CHRONIC SOB (SMOKER) BUT STATES SHE CAN WALK A MILE WITHOUT GETTING SOB OR HAVING CHEST PAIN  . Endometriosis   . Family history of adverse reaction to anesthesia    DAD-CAN HEAR SURGEONS TALKING DURING SURGERY BUT NEVER FEELS ANYTHING  . Seizures (HCC) 2016   X2 -NO MEDS    Patient Active Problem List   Diagnosis Date Noted  . Adenomyosis 12/01/2017  . Dysmenorrhea 11/09/2017  . Galactorrhea 11/09/2017  . Chronic pelvic pain in female 11/09/2017    Past Surgical History:  Procedure Laterality Date  . CESAREAN SECTION     X2  . CYSTOSCOPY  12/22/2017   Procedure: CYSTOSCOPY;  Surgeon: Nadara MustardHarris, Robert P, MD;  Location: [facility] ORS;  Service: Gynecology;;  . LAPAROSCOPIC HYSTERECTOMY Bilateral 12/22/2017   Procedure: HYSTERECTOMY TOTAL LAPAROSCOPIC BILATERAL SALPINGECTOMY;  Surgeon: Nadara MustardHarris, Robert P, MD;  Location: [facility] ORS;  Service: Gynecology;  Laterality: Bilateral;  . TUBAL  LIGATION      Prior to Admission medications   Medication Sig Start Date End Date Taking? Authorizing Provider  albuterol (PROVENTIL HFA;VENTOLIN HFA) 108 (90 Base) MCG/ACT inhaler Inhale 2 puffs into the lungs every 4 (four) hours as needed for wheezing or shortness of breath. 11/23/18   Cuthriell, Delorise RoyalsJonathan D, PA-C  diclofenac (CATAFLAM) 50 MG tablet Take 1 tablet (50 mg total) by mouth 3 (three) times daily. 02/28/19   Enid DerryWagner, Juwaun Inskeep, PA-C  fluticasone (FLONASE) 50 MCG/ACT nasal spray Place 2 sprays into both nostrils daily. 01/18/18 01/18/19  Bridget HartshornSummers, Rhonda L, PA-C  lidocaine (LIDODERM) 5 % Place 1 patch onto the skin daily. Remove & Discard patch within 12 hours or as directed by MD 02/28/19   Enid DerryWagner, Amoni Scallan, PA-C  methocarbamol (ROBAXIN) 500 MG tablet Take 1 tablet (500 mg total) by mouth every 8 (eight) hours as needed for up to 5 days. 02/25/19 03/02/19  Orvil FeilWoods, Jaclyn M, PA-C  predniSONE (STERAPRED UNI-PAK 21 TAB) 10 MG (21) TBPK tablet Take 6 tabs the the 1st day. Take 6 tabs the the 2nd day. Take 5 tabs the the 3rd day. Take 5 tabs the 4th day. Take 4 tabs the the 5th day.Take 4 tabs the the 6th day.Take 3 tabs the 7th day.Take 3 tabs the 8th day. Take 2 tabs the 9th day. Take 2 tabs the 10th day. Take 1 tab the 11th day. Take 1 tab the 12th day. 02/25/19  Pia Mau M, PA-C    Allergies Patient has no known allergies.  Family History  Problem Relation Age of Onset  . Breast cancer Mother   . Cancer Paternal Aunt   . Cancer Maternal Grandfather   . Cancer Paternal Aunt     Social History Social History   Tobacco Use  . Smoking status: Current Every Day Smoker    Packs/day: 1.50    Years: 22.00    Pack years: 33.00    Types: Cigarettes  . Smokeless tobacco: Never Used  Substance Use Topics  . Alcohol use: Yes    Comment: 3 TIMES WEEKLY  . Drug use: No     Review of Systems  Gastrointestinal:  No nausea, no vomiting.  Musculoskeletal: Positive for back pain. Skin: Negative  for rash, abrasions, lacerations, ecchymosis. Neurological: Negative for headaches, numbness.  Positive for tingling.   ____________________________________________   PHYSICAL EXAM:  VITAL SIGNS: ED Triage Vitals  Enc Vitals Group     BP 02/28/19 1100 133/85     Pulse Rate 02/28/19 1100 95     Resp 02/28/19 1100 18     Temp 02/28/19 1100 98.3 F (36.8 C)     Temp Source 02/28/19 1100 Oral     SpO2 02/28/19 1100 98 %     Weight 02/28/19 1101 180 lb (81.6 kg)     Height 02/28/19 1101  (1.702 m)     Head Circumference --      Peak Flow --      Pain Score 02/28/19 1115 9     Pain Loc --      Pain Edu? --      Excl. in GC? --      Constitutional: Alert and oriented. Well appearing and in no acute distress. Eyes: Conjunctivae are normal. PERRL. EOMI. Head: Atraumatic. ENT:      Ears:      Nose: No congestion/rhinnorhea.      Mouth/Throat: Mucous membranes are moist.  Neck: No stridor. Cardiovascular: Normal rate, regular rhythm.  Good peripheral circulation. Respiratory: Normal respiratory effort without tachypnea or retractions. Lungs CTAB. Good air entry to the bases with no decreased or absent breath sounds. Musculoskeletal: Full range of motion to all extremities. No gross deformities appreciated.  Strength equal in lower extremities bilaterally.  Sensation to bilateral lower extremities intact.  Positive straight leg raise.  Antalgic gait. Neurologic:  Normal speech and language. No gross focal neurologic deficits are appreciated.  Skin:  Skin is warm, dry and intact. No rash noted. Psychiatric: Mood and affect are normal. Speech and behavior are normal. Patient exhibits appropriate insight and judgement.   ____________________________________________   LABS (all labs ordered are listed, but only abnormal results are displayed)  Labs Reviewed  URINALYSIS, COMPLETE (UACMP) WITH MICROSCOPIC - Abnormal; Notable for the following components:      Result Value    Color, Urine STRAW (*)    APPearance CLEAR (*)    All other components within normal limits   ____________________________________________  EKG   ____________________________________________  RADIOLOGY   Mr Lumbar Spine Wo Contrast  Result Date: 02/28/2019 CLINICAL DATA:  Left side low back pain radiating into the left leg intermittently for the past 1-2 weeks. EXAM: MRI LUMBAR SPINE WITHOUT CONTRAST TECHNIQUE: Multiplanar, multisequence MR imaging of the lumbar spine was performed. No intravenous contrast was administered. COMPARISON:  None. FINDINGS: Segmentation:  Standard. Alignment:  Normal. Vertebrae:  Height and signal are normal. Conus medullaris and cauda equina: Conus  extends to the L1 level. Conus and cauda equina appear normal. Paraspinal and other soft tissues: Normal. Disc levels: Disc height and hydration are maintained at all levels. The central spinal canal and neural foramina are widely patent throughout. IMPRESSION: Normal lumbar spine MRI. Electronically Signed   By: Drusilla Kanner M.D.   On: 02/28/2019 14:05    ____________________________________________    PROCEDURES  Procedure(s) performed:    Procedures    Medications - No data to display   ____________________________________________   INITIAL IMPRESSION / ASSESSMENT AND PLAN / ED COURSE  Pertinent labs & imaging results that were available during my care of the patient were reviewed by me and considered in my medical decision making (see chart for details).  Review of the [place] CSRS was performed in accordance of the [facility] prior to dispensing any controlled drugs.   Patient's diagnosis is consistent with sciatica.  Vital signs and exam are reassuring.  MRI was ordered since patient had an episode of bowel incontinence yesterday.  MRI negative for abnormalities.  Dr. Marcell Barlow was paged and and surgery.  A message was left with his RN for outpatient follow-up with Ms. Lequita Halt.  Patient will be  discharged home with prescriptions for diclofenac.  She will continue steroids and muscle relaxer.  Patient is to follow up with neurosurgery as directed. Patient is given ED precautions to return to the ED for any worsening or new symptoms.     ____________________________________________  FINAL CLINICAL IMPRESSION(S) / ED DIAGNOSES  Final diagnoses:  Sciatica of left side      NEW MEDICATIONS STARTED DURING THIS VISIT:  ED Discharge Orders         Ordered    diclofenac (CATAFLAM) 50 MG tablet  3 times daily     02/28/19 1520    lidocaine (LIDODERM) 5 %  Every 24 hours     02/28/19 1521              This chart was dictated using voice recognition software/Dragon. Despite best efforts to proofread, errors can occur which can change the meaning. Any change was purely unintentional.    Enid Derry, PA-C 02/28/19 1609    Jeanmarie Plant, MD 03/01/19 [phone]

## 2019-02-28 NOTE — Discharge Instructions (Addendum)
Please continue steroids and muscle relaxers.  Add on diclofenac for inflammation. Continue heating pads. Please call Dr. Marcell Barlow for an appointment as soon as possible for reevaluation. Your MRI was reassuring but you will need to see him since you had that episode of bowel incontinence.

## 2019-05-23 ENCOUNTER — Other Ambulatory Visit: Payer: Self-pay | Admitting: Family Medicine

## 2019-05-23 ENCOUNTER — Other Ambulatory Visit: Payer: Self-pay

## 2019-05-23 ENCOUNTER — Encounter: Payer: Self-pay | Admitting: Family Medicine

## 2019-05-23 ENCOUNTER — Ambulatory Visit (INDEPENDENT_AMBULATORY_CARE_PROVIDER_SITE_OTHER): Payer: Medicaid Other | Admitting: Family Medicine

## 2019-05-23 DIAGNOSIS — N643 Galactorrhea not associated with childbirth: Secondary | ICD-10-CM | POA: Diagnosis not present

## 2019-05-23 DIAGNOSIS — J4520 Mild intermittent asthma, uncomplicated: Secondary | ICD-10-CM

## 2019-05-23 DIAGNOSIS — Z13220 Encounter for screening for lipoid disorders: Secondary | ICD-10-CM | POA: Diagnosis not present

## 2019-05-23 DIAGNOSIS — M792 Neuralgia and neuritis, unspecified: Secondary | ICD-10-CM

## 2019-05-23 DIAGNOSIS — R252 Cramp and spasm: Secondary | ICD-10-CM

## 2019-05-23 DIAGNOSIS — R102 Pelvic and perineal pain: Secondary | ICD-10-CM

## 2019-05-23 DIAGNOSIS — R3915 Urgency of urination: Secondary | ICD-10-CM

## 2019-05-23 DIAGNOSIS — M722 Plantar fascial fibromatosis: Secondary | ICD-10-CM | POA: Diagnosis not present

## 2019-05-23 DIAGNOSIS — Z9151 Personal history of suicidal behavior: Secondary | ICD-10-CM

## 2019-05-23 DIAGNOSIS — Z1239 Encounter for other screening for malignant neoplasm of breast: Secondary | ICD-10-CM

## 2019-05-23 DIAGNOSIS — F17200 Nicotine dependence, unspecified, uncomplicated: Secondary | ICD-10-CM | POA: Diagnosis not present

## 2019-05-23 DIAGNOSIS — Z915 Personal history of self-harm: Secondary | ICD-10-CM

## 2019-05-23 DIAGNOSIS — F331 Major depressive disorder, recurrent, moderate: Secondary | ICD-10-CM

## 2019-05-23 LAB — POCT URINALYSIS DIPSTICK
Bilirubin, UA: NEGATIVE
Blood, UA: NEGATIVE
Glucose, UA: NEGATIVE
Ketones, UA: NEGATIVE
Leukocytes, UA: NEGATIVE
Nitrite, UA: NEGATIVE
Protein, UA: NEGATIVE
Spec Grav, UA: 1.015 (ref 1.010–1.025)
Urobilinogen, UA: 0.2 E.U./dL
pH, UA: 7.5 (ref 5.0–8.0)

## 2019-05-23 NOTE — Progress Notes (Signed)
Patient: Melinda Olsen, Female    DOB: [birth year], 43 y.o.   MRN: [id number] Visit Date: 05/23/2019  Today's Provider: Shirlee LatchAngela , MD   Chief Complaint  Patient presents with  . New Patient (Initial Visit)   Subjective:    New Patient Appointment Melinda Olsen is a 43 y.o. female who presents today for new patient appointment. She feels fairly well. She reports exercising currently per pt. She reports she is sleeping poorly.  ----------------------------------------------------------------- Has been several years since havign a PCP  Had 2 seizures in 2016 related to OD with attempted suicide. Never taken medications  History of Endometriosis, s/p hyste[facility]. 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 - Worse in the last 6 months since gaining weight after hysterectomy  - has never taken controller inhaler - using albuterol every 1-2 days - needs refill on Albuterol MDI - has smoked 1 PPD x23 yrs - not ready to quit as she feels her stress level is too high for this currently - has tried to quit smoking in the past without success  - Feels restricted when breathing while cleaning at work and wants note that she doesn't need to wear mask   Depression and anxiety:  - Feels as though these are Fairly well controlled currently - admits to intermittent rage outbursts with throwing things - not seeing a therapist and not currently on any medications - has tried several medications in the past (Zoloft, Prozac, Valium, etc) - has h/o intentional OD on Wellbutrin - had 2 seizures in the setting of intentional OD/suicide attempt - has never taken AEDs  Leg cramps in both legs while sleeping at night - ongoing for several months - will wake her from sleep - no claudication  R heel pain - described as bottom of foot - sounds to be at  insertio of plantar fascia - worse with walking or standing and with first step in the morning - ongoing for many months - she has not tried any treatment - reports that she wears supportive shoes  Galactorrhea - L breast seems to fill up and gets painful - she is able to express and it is clear to white in color - Had mammogram 11/18/17 - wnl - previously evaluated by GYN and told that it was likely physiologic - she is concerned that it continues to happen and wants some hormones to make it better  Intermittent R arm (shoulder to hand) numbness/tingling as if it has fallen asleep Occurs when sitting in one place for too long Not associated with neck pain Self limited for minutes  Urinary urgency, pelvic pain x2-3 days Feels urge to urinate even after just urinating No hematuria Feels like previous UTI    Review of Systems  Constitutional: Negative.   HENT: Negative.   Eyes: Negative.   Respiratory: Negative.   Cardiovascular: Negative.   Gastrointestinal: Negative.   Endocrine: Negative.   Genitourinary: Negative.   Musculoskeletal: Positive for back pain.  Allergic/Immunologic: Negative.   Neurological: Negative.   Hematological: Negative.   Psychiatric/Behavioral: Negative.     Social History She  reports that she has been smoking cigarettes. She has a 33.00 pack-year smoking history. She has never used smokeless tobacco. She reports current alcohol use. She reports that she does not use drugs. Social History   Socioeconomic History  .  Marital status: Married    Spouse name: Not on file  . Number of children: Not on file  . Years of education: Not on file  . Highest education level: Not on file  Occupational History  . Not on file  Social Needs  . Financial resource strain: Not on file  . Food insecurity    Worry: Not on file    Inability: Not on file  . Transportation needs    Medical: Not on file    Non-medical: Not on file  Tobacco Use  . Smoking  status: Current Every Day Smoker    Packs/day: 1.50    Years: 22.00    Pack years: 33.00    Types: Cigarettes  . Smokeless tobacco: Never Used  Substance and Sexual Activity  . Alcohol use: Yes    Comment: 3 TIMES WEEKLY  . Drug use: No  . Sexual activity: Yes    Birth control/protection: None  Lifestyle  . Physical activity    Days per week: Not on file    Minutes per session: Not on file  . Stress: Not on file  Relationships  . Social Musicianconnections    Talks on phone: Not on file    Gets together: Not on file    Attends religious service: Not on file    Active member of club or organization: Not on file    Attends meetings of clubs or organizations: Not on file    Relationship status: Not on file  Other Topics Concern  . Not on file  Social History Narrative  . Not on file    Patient Active Problem List   Diagnosis Date Noted  . Adenomyosis 12/01/2017  . Dysmenorrhea 11/09/2017  . Galactorrhea 11/09/2017  . Chronic pelvic pain in female 11/09/2017    Past Surgical History:  Procedure Laterality Date  . CESAREAN SECTION     X2  . CYSTOSCOPY  12/22/2017   Procedure: CYSTOSCOPY;  Surgeon: Nadara MustardHarris, Robert P, MD;  Location: [facility] ORS;  Service: Gynecology;;  . LAPAROSCOPIC HYSTERECTOMY Bilateral 12/22/2017   Procedure: HYSTERECTOMY TOTAL LAPAROSCOPIC BILATERAL SALPINGECTOMY;  Surgeon: Nadara MustardHarris, Robert P, MD;  Location: [facility] ORS;  Service: Gynecology;  Laterality: Bilateral;  . TUBAL LIGATION      Family History  Family Status  Relation Name Status  . Mother  (Not Specified)  . Emelda BrothersPat Aunt  (Not Specified)  . MGF  (Not Specified)  . Emelda BrothersPat Aunt  (Not Specified)   Her family history includes Breast cancer in her mother; Cancer in her maternal grandfather, paternal aunt, and paternal aunt.     No Known Allergies  Previous Medications   ALBUTEROL (PROVENTIL HFA;VENTOLIN HFA) 108 (90 BASE) MCG/ACT INHALER    Inhale 2 puffs into the lungs every 4 (four) hours as needed for  wheezing or shortness of breath.   DICLOFENAC (CATAFLAM) 50 MG TABLET    Take 1 tablet (50 mg total) by mouth 3 (three) times daily.   FLUTICASONE (FLONASE) 50 MCG/ACT NASAL SPRAY    Place 2 sprays into both nostrils daily.   LIDOCAINE (LIDODERM) 5 %    Place 1 patch onto the skin daily. Remove & Discard patch within 12 hours or as directed by MD   PREDNISONE (STERAPRED UNI-PAK 21 TAB) 10 MG (21) TBPK TABLET    Take 6 tabs the the 1st day. Take 6 tabs the the 2nd day. Take 5 tabs the the 3rd day. Take 5 tabs the 4th day. Take 4 tabs the the  5th day.Take 4 tabs the the 6th day.Take 3 tabs the 7th day.Take 3 tabs the 8th day. Take 2 tabs the 9th day. Take 2 tabs the 10th day. Take 1 tab the 11th day. Take 1 tab the 12th day.    Patient Care Team: Erasmo DownerBacigalupo,  M, MD as PCP - General (Family Medicine) Jim LikeLambert, Sheena M, RN as Registered Nurse Scarlett PrestoShaver, Anne F, RN as Registered Nurse      Objective:   Vitals: LMP 11/29/2017 (Exact Date)    Physical Exam Constitutional:      General: She is not in acute distress.    Appearance: Normal appearance.  HENT:     Head: Normocephalic and atraumatic.  Eyes:     General: No scleral icterus.    Conjunctiva/sclera: Conjunctivae normal.  Pulmonary:     Effort: Pulmonary effort is normal. No respiratory distress.  Neurological:     Mental Status: She is alert and oriented to person, place, and time. Mental status is at baseline.  Psychiatric:        Mood and Affect: Mood is anxious. Affect is flat.      Depression Screen PHQ 2/9 Scores 05/23/2019  PHQ - 2 Score 4  PHQ- 9 Score 16     Assessment & Plan:     Establish Care  Exercise Activities and Dietary recommendations Goals   None      There is no immunization history on file for this patient.  Health Maintenance  Topic Date Due  . HIV Screening  07/06/1992  . TETANUS/TDAP  07/06/1996  . INFLUENZA VACCINE  05/26/2019  . PAP SMEAR-Modifier  11/09/2020     Discussed  health benefits of physical activity, and encouraged her to engage in regular exercise appropriate for her age and condition.    --------------------------------------------------------------------  Problem List Items Addressed This Visit      Respiratory   Mild intermittent asthma without complication    Mild and fairly well controlled Suspect that her SOB may be related to asthma Advised albuterol use to see if this helps If increases albuterol use, may benefit from ICS for controller Discussed that she should wear a mask when around others in the current pandemic and she is higher risk given asthma - will not provide note excusing mask        Musculoskeletal and Integument   Plantar fasciitis    New problem Description of pain and location from patient consistent with R plantar fasciitis Discussed stretching and icing May consider PT in the future Discussed natural course and supportive footwear with arch support        Other   Galactorrhea - Primary    Longstanding issue with normal mammogram Was previously evaluated by GYN and they suspected this was physiologic We will repeat diagnostic mammogram and ultrasound We will check prolactin and TSH She has had a hysterectomy and therefore we do not need to check an hCG As it is unusual that it is unilateral, we may consider breast MRI if other imaging is unrevealing      Relevant Orders   TSH (Completed)   Prolactin (Completed)   MM 3D SCREEN BREAST BILATERAL   Bilateral leg cramps    Longstanding problem Discussed the importance of stretching before getting into bed We will check TSH, CMP, magnesium, CBC to see if there is any underlying issue Discussed possible use of tonic water or magnesium supplements to help      Relevant Orders   CBC w/Diff/Platelet (Completed)  Comprehensive metabolic panel (Completed)   Magnesium (Completed)   Radicular pain in right arm    Intermittent and positional with no longstanding  weakness or numbness Discussed importance of position changes and reassured If becomes chronic, would need to consider neck imaging      Suprapubic pain    Reportedly new problem, patient does have history of chronic pelvic pain noted in her chart As she is having suprapubic pain and urinary frequency/urgency, there is concern for UTI We will have patient come by to leave urine sample for UA and urine culture Treatment pending results      Relevant Orders   POCT urinalysis dipstick (Completed)   Urine Culture   Urinary urgency    Concern for possible UTI Plan above her suprapubic pain UA and urine culture and treatment pending results      Relevant Orders   POCT urinalysis dipstick (Completed)   Urine Culture   Tobacco use disorder    3 to 5-minute discussion regarding the harms of continuing to smoke and the benefits of tobacco cessation Patient remains pre-contemplative We discussed that there are medical options to help with tobacco cessation and we will continue to reassess Of note, would not use Zyban as she has history of intentional overdose with Wellbutrin and will be very cautious with Chantix given her history of suicide attempt      History of suicide attempt    History of intentional overdose with Wellbutrin She is doing well with no SI currently Contracted for safety      MDD (major depressive disorder)    PHQ 9 remains elevated, but patient states her symptoms are fairly well-controlled She states she has tried too many medications in the past and does not want to take a medication for depression We discussed the importance of therapy in the treatment of depression Contracted for safety-no SI/HI       Other Visit Diagnoses    Screening for breast cancer       Relevant Orders   MM 3D SCREEN BREAST BILATERAL   Screening for lipid disorders       Relevant Orders   Lipid panel (Completed)   Comprehensive metabolic panel (Completed)       Return in  about 3 months (around 08/23/2019) for CPE.   The entirety of the information documented in the History of Present Illness, Review of Systems and Physical Exam were personally obtained by me. Portions of this information were initially documented by [facility], CMA and reviewed by me for thoroughness and accuracy.    , Dionne Bucy, MD MPH [facility]

## 2019-05-24 ENCOUNTER — Telehealth: Payer: Self-pay

## 2019-05-24 LAB — CBC WITH DIFFERENTIAL/PLATELET
Basophils Absolute: 0.1 10*3/uL (ref 0.0–0.2)
Basos: 1 %
EOS (ABSOLUTE): 0.4 10*3/uL (ref 0.0–0.4)
Eos: 3 %
Hematocrit: 43.3 % (ref 34.0–46.6)
Hemoglobin: 14.9 g/dL (ref 11.1–15.9)
Immature Grans (Abs): 0 10*3/uL (ref 0.0–0.1)
Immature Granulocytes: 0 %
Lymphocytes Absolute: 3.7 10*3/uL — ABNORMAL HIGH (ref 0.7–3.1)
Lymphs: 31 %
MCH: 32.8 pg (ref 26.6–33.0)
MCHC: 34.4 g/dL (ref 31.5–35.7)
MCV: 95 fL (ref 79–97)
Monocytes Absolute: 0.6 10*3/uL (ref 0.1–0.9)
Monocytes: 5 %
Neutrophils Absolute: 7.1 10*3/uL — ABNORMAL HIGH (ref 1.4–7.0)
Neutrophils: 60 %
Platelets: 316 10*3/uL (ref 150–450)
RBC: 4.54 x10E6/uL (ref 3.77–5.28)
RDW: 11.9 % (ref 11.7–15.4)
WBC: 11.9 10*3/uL — ABNORMAL HIGH (ref 3.4–10.8)

## 2019-05-24 LAB — LIPID PANEL
Chol/HDL Ratio: 3.9 ratio (ref 0.0–4.4)
Cholesterol, Total: 206 mg/dL — ABNORMAL HIGH (ref 100–199)
HDL: 53 mg/dL (ref >39)
LDL Calculated: 115 mg/dL — ABNORMAL HIGH (ref 0–99)
Triglycerides: 188 mg/dL — ABNORMAL HIGH (ref 0–149)
VLDL Cholesterol Cal: 38 mg/dL (ref 5–40)

## 2019-05-24 LAB — COMPREHENSIVE METABOLIC PANEL
ALT: 17 IU/L (ref 0–32)
AST: 20 IU/L (ref 0–40)
Albumin/Globulin Ratio: 1.9 (ref 1.2–2.2)
Albumin: 4.7 g/dL (ref 3.8–4.8)
Alkaline Phosphatase: 76 IU/L (ref 39–117)
BUN/Creatinine Ratio: 8 — ABNORMAL LOW (ref 9–23)
BUN: 7 mg/dL (ref 6–24)
Bilirubin Total: <0.2 mg/dL (ref 0.0–1.2)
CO2: 22 mmol/L (ref 20–29)
Calcium: 9.4 mg/dL (ref 8.7–10.2)
Chloride: 104 mmol/L (ref 96–106)
Creatinine, Ser: 0.85 mg/dL (ref 0.57–1.00)
GFR calc Af Amer: 98 mL/min/{1.73_m2} (ref >59)
GFR calc non Af Amer: 85 mL/min/{1.73_m2} (ref >59)
Globulin, Total: 2.5 g/dL (ref 1.5–4.5)
Glucose: 87 mg/dL (ref 65–99)
Potassium: 3.9 mmol/L (ref 3.5–5.2)
Sodium: 144 mmol/L (ref 134–144)
Total Protein: 7.2 g/dL (ref 6.0–8.5)

## 2019-05-24 LAB — PROLACTIN: Prolactin: 6.6 ng/mL (ref 4.8–23.3)

## 2019-05-24 LAB — MAGNESIUM: Magnesium: 2.1 mg/dL (ref 1.6–2.3)

## 2019-05-24 LAB — TSH: TSH: 0.553 u[IU]/mL (ref 0.450–4.500)

## 2019-05-24 NOTE — Telephone Encounter (Signed)
Patient notified of lab results

## 2019-05-24 NOTE — Telephone Encounter (Signed)
-----   Message from Virginia Crews, MD sent at 05/24/2019 11:21 AM EDT ----- Normal labs, except WBC is elevated.  This could be related to any recent infection.  Recommend rechecking in 1 month.  Choelsterol is also elevated, but not needing a medication at this time.  recommend diet low in saturated fat and regular exercise - 30 min at least 5 times per week

## 2019-05-24 NOTE — Telephone Encounter (Signed)
-----   Message from Virginia Crews, MD sent at 05/23/2019  3:28 PM EDT ----- Patient's urine is normal with no signs of infection. Will still send culture to rule out any occult infection.  If pain persists, let us know

## 2019-05-24 NOTE — Telephone Encounter (Signed)
Patient was advised and states that she will work on diet.

## 2019-05-25 DIAGNOSIS — F17200 Nicotine dependence, unspecified, uncomplicated: Secondary | ICD-10-CM | POA: Insufficient documentation

## 2019-05-25 DIAGNOSIS — R102 Pelvic and perineal pain: Secondary | ICD-10-CM | POA: Insufficient documentation

## 2019-05-25 DIAGNOSIS — J4520 Mild intermittent asthma, uncomplicated: Secondary | ICD-10-CM | POA: Insufficient documentation

## 2019-05-25 DIAGNOSIS — F329 Major depressive disorder, single episode, unspecified: Secondary | ICD-10-CM | POA: Insufficient documentation

## 2019-05-25 DIAGNOSIS — Z915 Personal history of self-harm: Secondary | ICD-10-CM | POA: Insufficient documentation

## 2019-05-25 DIAGNOSIS — M792 Neuralgia and neuritis, unspecified: Secondary | ICD-10-CM | POA: Insufficient documentation

## 2019-05-25 DIAGNOSIS — M722 Plantar fascial fibromatosis: Secondary | ICD-10-CM | POA: Insufficient documentation

## 2019-05-25 DIAGNOSIS — R3915 Urgency of urination: Secondary | ICD-10-CM | POA: Insufficient documentation

## 2019-05-25 DIAGNOSIS — Z9151 Personal history of suicidal behavior: Secondary | ICD-10-CM | POA: Insufficient documentation

## 2019-05-25 DIAGNOSIS — R252 Cramp and spasm: Secondary | ICD-10-CM | POA: Insufficient documentation

## 2019-05-25 LAB — URINE CULTURE

## 2019-05-25 NOTE — Assessment & Plan Note (Signed)
New problem Description of pain and location from patient consistent with R plantar fasciitis Discussed stretching and icing May consider PT in the future Discussed natural course and supportive footwear with arch support

## 2019-05-25 NOTE — Assessment & Plan Note (Signed)
Concern for possible UTI Plan above her suprapubic pain UA and urine culture and treatment pending results

## 2019-05-25 NOTE — Assessment & Plan Note (Addendum)
Longstanding issue with normal mammogram Was previously evaluated by GYN and they suspected this was physiologic We will repeat diagnostic mammogram and ultrasound We will check prolactin and TSH She has had a hysterectomy and therefore we do not need to check an hCG As it is unusual that it is unilateral, we may consider breast MRI if other imaging is unrevealing

## 2019-05-25 NOTE — Assessment & Plan Note (Addendum)
Mild and fairly well controlled Suspect that her SOB may be related to asthma Advised albuterol use to see if this helps If increases albuterol use, may benefit from ICS for controller Discussed that she should wear a mask when around others in the current pandemic and she is higher risk given asthma - will not provide note excusing mask

## 2019-05-25 NOTE — Assessment & Plan Note (Signed)
PHQ 9 remains elevated, but patient states her symptoms are fairly well-controlled She states she has tried too many medications in the past and does not want to take a medication for depression We discussed the importance of therapy in the treatment of depression Contracted for safety-no SI/HI

## 2019-05-25 NOTE — Assessment & Plan Note (Signed)
Reportedly new problem, patient does have history of chronic pelvic pain noted in her chart As she is having suprapubic pain and urinary frequency/urgency, there is concern for UTI We will have patient come by to leave urine sample for UA and urine culture Treatment pending results

## 2019-05-25 NOTE — Assessment & Plan Note (Signed)
Longstanding problem Discussed the importance of stretching before getting into bed We will check TSH, CMP, magnesium, CBC to see if there is any underlying issue Discussed possible use of tonic water or magnesium supplements to help

## 2019-05-25 NOTE — Assessment & Plan Note (Signed)
Intermittent and positional with no longstanding weakness or numbness Discussed importance of position changes and reassured If becomes chronic, would need to consider neck imaging

## 2019-05-25 NOTE — Assessment & Plan Note (Addendum)
3 to 5-minute discussion regarding the harms of continuing to smoke and the benefits of tobacco cessation Patient remains pre-contemplative We discussed that there are medical options to help with tobacco cessation and we will continue to reassess Of note, would not use Zyban as she has history of intentional overdose with Wellbutrin and will be very cautious with Chantix given her history of suicide attempt

## 2019-05-25 NOTE — Assessment & Plan Note (Signed)
History of intentional overdose with Wellbutrin She is doing well with no SI currently Contracted for safety

## 2019-05-29 ENCOUNTER — Ambulatory Visit: Payer: Medicaid Other | Admitting: Family Medicine

## 2019-06-01 ENCOUNTER — Ambulatory Visit
Admission: RE | Admit: 2019-06-01 | Discharge: 2019-06-01 | Disposition: A | Discharge status: Home / Self Care | Payer: Medicaid Other | Source: Ambulatory Visit | Attending: Family Medicine | Admitting: Family Medicine

## 2019-06-01 DIAGNOSIS — N643 Galactorrhea not associated with childbirth: Secondary | ICD-10-CM | POA: Diagnosis not present

## 2019-06-01 DIAGNOSIS — N6489 Other specified disorders of breast: Secondary | ICD-10-CM | POA: Diagnosis not present

## 2019-06-01 DIAGNOSIS — R922 Inconclusive mammogram: Secondary | ICD-10-CM | POA: Diagnosis not present

## 2019-06-01 DIAGNOSIS — N6452 Nipple discharge: Secondary | ICD-10-CM | POA: Diagnosis not present

## 2019-06-04 ENCOUNTER — Telehealth: Payer: Self-pay

## 2019-06-04 DIAGNOSIS — N643 Galactorrhea not associated with childbirth: Secondary | ICD-10-CM

## 2019-06-04 NOTE — Telephone Encounter (Signed)
Patient was advised.  

## 2019-06-04 NOTE — Telephone Encounter (Signed)
-----   Message from Melinda Crews, MD sent at 06/04/2019 10:20 AM EDT ----- Mammogram is normal, but given ongoing nipple discharge, she needs breast MRI and surgery referral.  Is she ok with this? If so, we can order these things.

## 2019-06-04 NOTE — Telephone Encounter (Signed)
Patient advised. She is okay with proceeding with MRI and referral. I was not sure which ones to order.

## 2019-06-04 NOTE — Telephone Encounter (Signed)
Orders placed. She will be called about scheduling these.

## 2019-06-13 ENCOUNTER — Ambulatory Visit: Payer: Medicaid Other | Admitting: Surgery

## 2019-06-14 ENCOUNTER — Telehealth: Payer: Self-pay | Admitting: Family Medicine

## 2019-06-14 DIAGNOSIS — N643 Galactorrhea not associated with childbirth: Secondary | ICD-10-CM

## 2019-06-14 NOTE — Telephone Encounter (Signed)
Per [facility] imaging they will need order for MRI of breast changed to bilateral breast MRI W/WO CPT 77049.Can you change order, Thanks ?

## 2019-06-14 NOTE — Telephone Encounter (Signed)
New order placed

## 2019-06-15 ENCOUNTER — Encounter: Payer: Self-pay | Admitting: Surgery

## 2019-06-15 ENCOUNTER — Telehealth: Payer: Self-pay | Admitting: Family Medicine

## 2019-06-15 ENCOUNTER — Other Ambulatory Visit: Payer: Self-pay

## 2019-06-15 ENCOUNTER — Ambulatory Visit (INDEPENDENT_AMBULATORY_CARE_PROVIDER_SITE_OTHER): Payer: Medicaid Other | Admitting: Surgery

## 2019-06-15 VITALS — BP 118/80 | HR 96 | Temp 97.8°F | Ht 66.0 in | Wt 176.0 lb

## 2019-06-15 DIAGNOSIS — N6452 Nipple discharge: Secondary | ICD-10-CM

## 2019-06-15 NOTE — Progress Notes (Signed)
06/15/2019  Reason for Visit:  Left nipple discharge  Referring Provider:  Lavon Paganini, MD  History of Present Illness: Melinda Olsen is a 43 y.o. female presenting for evaluation of left nipple discharge.  Patient reports that this started about 1.5 years ago, and comes in cycles of about 3 months each.  She reports that she feels pressure in her left breast, followed by the drainage, and then her symptoms resolve.  Reports she had a mammogram and ultrasound when her symptoms started which were negative, and was told it was hormonal in etiology.  However, her symptoms have continued.  Describes the drainage as clear to milky in consistency, with occasional blood.  This is isolated to the left breast and has never had involvement of the right breast.  Denies any fevers, chills, chest pain, shortness of breath out of the ordinary with her asthma.  She recently had a repeat mammogram and ultrasound which were also negative.  However, she was also found to have a small papule at the medial aspect of the left nipple.  She denies any drainage from that area.  She cannot tell a specific duct that the drainage is coming from.  Past Medical History: Past Medical History:  Diagnosis Date  . Anxiety    NO MEDS  . Asthma    WELL CONTROLLED  . Depression    NO MEDS  . Dyspnea    PT STATES CHRONIC SOB (SMOKER) BUT STATES SHE CAN WALK A MILE WITHOUT GETTING SOB OR HAVING CHEST PAIN  . Endometriosis   . Family history of adverse reaction to anesthesia    DAD-CAN HEAR SURGEONS TALKING DURING SURGERY BUT NEVER FEELS ANYTHING  . Seizures ([place]) 2016   X2 -NO MEDS     Past Surgical History: Past Surgical History:  Procedure Laterality Date  . CESAREAN SECTION     X2  . CYSTOSCOPY  12/22/2017   Procedure: CYSTOSCOPY;  Surgeon: Gae Dry, MD;  Location: [facility] ORS;  Service: Gynecology;;  . LAPAROSCOPIC HYSTERECTOMY Bilateral 12/22/2017   Procedure: HYSTERECTOMY TOTAL LAPAROSCOPIC  BILATERAL SALPINGECTOMY;  Surgeon: Gae Dry, MD;  Location: [facility] ORS;  Service: Gynecology;  Laterality: Bilateral;  . TUBAL LIGATION      Home Medications: Prior to Admission medications   Medication Sig Start Date End Date Taking? Authorizing Provider  albuterol (PROVENTIL HFA;VENTOLIN HFA) 108 (90 Base) MCG/ACT inhaler Inhale 2 puffs into the lungs every 4 (four) hours as needed for wheezing or shortness of breath. 11/23/18  Yes Cuthriell, Charline Bills, PA-C  ibuprofen (ADVIL) 200 MG tablet Take 200 mg by mouth every 6 (six) hours as needed.   Yes [provider]    Allergies: No Known Allergies  Social History:  reports that she has been smoking cigarettes. She has a 23.00 pack-year smoking history. She has never used smokeless tobacco. She reports current alcohol use of about 2.0 standard drinks of alcohol per week. She reports that she does not use drugs.   Family History: Family History  Problem Relation Age of Onset  . Throat cancer Mother   . Diabetes Mother   . Breast cancer Paternal Aunt   . Cervical cancer Paternal Aunt   . Diabetes Paternal Aunt   . Lung cancer Maternal Grandfather   . Heart attack Paternal Grandmother   . Diabetes Paternal Grandmother   . Lung cancer Paternal Aunt   . Diabetes Paternal Aunt     Review of Systems: Review of Systems  Constitutional: Negative  for chills and fever.  Respiratory: Negative for shortness of breath.   Cardiovascular: Negative for chest pain.  Gastrointestinal: Negative for nausea and vomiting.  Genitourinary: Negative for dysuria.  Musculoskeletal: Negative for myalgias.  Skin: Negative for rash.       Drainage from left nipple  Neurological: Negative for dizziness.  Psychiatric/Behavioral: Negative for depression.    Physical Exam BP 118/80   Pulse 96   Temp 97.8 F (36.6 C)   Ht 5\' 6"  (1.676 m)   Wt 176 lb (79.8 kg)   LMP 11/29/2017 (Exact Date)   SpO2 97%   BMI 28.41 kg/m   CONSTITUTIONAL: No acute distress HEENT:  Normocephalic, atraumatic, extraocular motion intact. NECK: Trachea is midline, and there is no jugular venous distension.  RESPIRATORY:  Lungs are clear, and breath sounds are equal bilaterally. Normal respiratory effort without pathologic use of accessory muscles. CARDIOVASCULAR: Heart is regular without murmurs, gallops, or rubs. BREAST:  Right breast without any abnormalities, palpable masses, nipple drainage, or skin changes.  No right axillary lymphadenopathy.  On the left breast, no palpable masses except for a small 5 mm papule in the medial portion of the nipple itself. There is drainage of keratin like consistency material from the center of the nipple at the 2 o clock position of the nipple itself.  No drainage from the papule.   GI: The abdomen is soft, non-distended, non-tender. MUSCULOSKELETAL:  Normal muscle strength and tone in all four extremities.  No peripheral edema or cyanosis. SKIN: Skin turgor is normal. There are no pathologic skin lesions.  NEUROLOGIC:  Motor and sensation is grossly normal.  Cranial nerves are grossly intact. PSYCH:  Alert and oriented to person, place and time. Affect is normal.  Laboratory Analysis: Labs 05/23/19: Prolactin 6.6, TSH 0.553  Imaging: Mammogram and U/S 06/01/19: FINDINGS: Mammogram: No suspicious mass, distortion, or microcalcifications are identified to suggest presence of malignancy. A spot tomosynthesis views were performed for the left retroareolar region demonstrating no discrete abnormality.  Mammographic images were processed with CAD.  On physical exam, there is a small raised erythematous papule on the nipple without visualized drainage. This may represent a small area of inflammation.  Targeted ultrasound is performed of the left breast, showing no discrete cystic or solid mass in the retroareolar region. No dilated ducts identified. No sonographic evidence of  malignancy.  IMPRESSION: No mammographic or sonographic evidence of malignancy. No finding to explain the patient's ongoing left nipple discharge.  RECOMMENDATION: 1. Given the patient's ongoing nipple discharge with suspicious features (bloody, spontaneous), breast MRI is recommended for further evaluation.  2. Surgical consultation is recommended for further management/monitoring of the left nipple discharge and for follow-up of the small red papule on the left nipple.  Assessment and Plan: This is a 43 y.o. female presenting for evaluation of ongoing left nipple discharge for about 1.5 years.  Discussed with the patient that we are dealing with pathologic discharge as this is unilateral and she's not pregnant.  Her prolactin is normal which would decrease the suspicion for a pituitary gland prolactinoma.  Discussed with the patient that two potential etiologies would be an intraductal papilloma or mammary duct ectasia.  I would be surprised if this were a malignant lesion as this has been ongoing for 1.5 years at least and we have to sets of images that are negative.  However, I could not guarantee this to the patient.  Radiology recommended bilateral breast MRI in order to further evaluate her left  breast and this had been ordered by her PCP.  We confirmed with [facility] that they would call the patient to schedule the imaging appointment.    At this point, I think we need further workup with the MRI.  If the MRI finds any suspicious lesions, this may be amenable to biopsy and we can decide based on findings what to do.  If the MRI is negative, then we could potentially take her to the operating room to try duct excision of the involved duct.  Discussed briefly with patient that a thin metal wire or probe is inserted into the duct of concern and a periareolar incision is made and the duct is excised.  We may also excise the papule she has as well if it looks involved.  The  patient agrees with this plan and will get her MRI done.  Discussed with her that I will be unavailable for the next two weeks but in the meantime, the MRI would be done and any biopsies if needed.  She will follow up with me after Labor Day to discuss the findings and potential surgical options.  Face-to-face time spent with the patient and care providers was 60 minutes, with more than 50% of the time spent counseling, educating, and coordinating care of the patient.     Howie IllJose Luis Lulla [place], MD [facility]

## 2019-06-15 NOTE — Patient Instructions (Addendum)
[facility] will call you to schedule your breast MRI.   Follow up here in 2-3 weeks to review. Call us if symptoms worsen before then.   Follow up on September 8th at 3:45 pm.

## 2019-06-15 NOTE — Telephone Encounter (Signed)
Auth # given to [id number]

## 2019-06-15 NOTE — Telephone Encounter (Signed)
Denise w/ [place] [phone]  Needing to know if pt has Pre-Certification for a MRI.  Please advise.  Thanks, [employer]

## 2019-06-27 ENCOUNTER — Other Ambulatory Visit: Payer: Self-pay

## 2019-06-27 ENCOUNTER — Ambulatory Visit
Admission: RE | Admit: 2019-06-27 | Discharge: 2019-06-27 | Disposition: A | Discharge status: Home / Self Care | Payer: Medicaid Other | Source: Ambulatory Visit | Attending: Family Medicine | Admitting: Family Medicine

## 2019-06-27 DIAGNOSIS — N6489 Other specified disorders of breast: Secondary | ICD-10-CM | POA: Diagnosis not present

## 2019-06-27 DIAGNOSIS — N643 Galactorrhea not associated with childbirth: Secondary | ICD-10-CM | POA: Diagnosis not present

## 2019-06-27 MED ORDER — GADOBUTROL 1 MMOL/ML IV SOLN
8.0000 mL | Freq: Once | INTRAVENOUS | Status: AC | PRN
  Administered 2019-06-27: 15:00:00 7.5 mL via INTRAVENOUS

## 2019-07-03 ENCOUNTER — Other Ambulatory Visit: Payer: Self-pay

## 2019-07-03 ENCOUNTER — Ambulatory Visit (INDEPENDENT_AMBULATORY_CARE_PROVIDER_SITE_OTHER): Payer: Medicaid Other | Admitting: Surgery

## 2019-07-03 ENCOUNTER — Encounter: Payer: Self-pay | Admitting: Surgery

## 2019-07-03 VITALS — BP 122/85 | HR 98 | Temp 98.5°F | Ht 66.0 in | Wt 178.0 lb

## 2019-07-03 DIAGNOSIS — R928 Other abnormal and inconclusive findings on diagnostic imaging of breast: Secondary | ICD-10-CM | POA: Diagnosis not present

## 2019-07-03 DIAGNOSIS — N6452 Nipple discharge: Secondary | ICD-10-CM | POA: Diagnosis not present

## 2019-07-03 NOTE — Patient Instructions (Signed)
We will arrange for you to have an MRI guided biopsy of the right breast. This will be done in [place] at [place].   Follow up here after your biopsies.

## 2019-07-04 NOTE — Progress Notes (Signed)
07/04/2019  History of Present Illness: Melinda Olsen is a 43 y.o. female presenting to discuss bilateral breast MRI results.  She was last seen on 8/21 for left nipple discharge.  She had had negative mammogram and ultrasound, and MRI was recommended.  This was ordered and done on 9/3.  Reviewing the MRI, there is no evidence of a mass or suspicious finding on the left breast that could be the source of the nipple discharge.  However, there is new findings of non-mass enhancement areas of the right breast, and MRI-guided biopsy has been recommended.  The patient denies any new symptoms and denies any issues with the right breast.  My prior exam of the right breast was negative for any palpable masses, skin changes, or nipple retraction.  There is still some slight discharge from the left nipple, but the patient reports that it has decreased since my prior exam as I did squeeze some fluid out and she feels the fluid pressure has not built up enough just yet.  Past Medical History: Past Medical History:  Diagnosis Date  . Anxiety    NO MEDS  . Asthma    WELL CONTROLLED  . Depression    NO MEDS  . Dyspnea    PT STATES CHRONIC SOB (SMOKER) BUT STATES SHE CAN WALK A MILE WITHOUT GETTING SOB OR HAVING CHEST PAIN  . Endometriosis   . Family history of adverse reaction to anesthesia    DAD-CAN HEAR SURGEONS TALKING DURING SURGERY BUT NEVER FEELS ANYTHING  . Seizures ([facility]) 2016   X2 -NO MEDS     Past Surgical History: Past Surgical History:  Procedure Laterality Date  . CESAREAN SECTION     X2  . CYSTOSCOPY  12/22/2017   Procedure: CYSTOSCOPY;  Surgeon: Nadara MustardHarris, Robert P, MD;  Location: [facility] ORS;  Service: Gynecology;;  . LAPAROSCOPIC HYSTERECTOMY Bilateral 12/22/2017   Procedure: HYSTERECTOMY TOTAL LAPAROSCOPIC BILATERAL SALPINGECTOMY;  Surgeon: Nadara MustardHarris, Robert P, MD;  Location: [facility] ORS;  Service: Gynecology;  Laterality: Bilateral;  . TUBAL LIGATION      Home Medications: Prior to  Admission medications   Medication Sig Start Date End Date Taking? Authorizing Provider  albuterol (PROVENTIL HFA;VENTOLIN HFA) 108 (90 Base) MCG/ACT inhaler Inhale 2 puffs into the lungs every 4 (four) hours as needed for wheezing or shortness of breath. 11/23/18  Yes Cuthriell, Delorise RoyalsJonathan D, PA-C  ibuprofen (ADVIL) 200 MG tablet Take 200 mg by mouth every 6 (six) hours as needed.   Yes [provider]    Allergies: No Known Allergies  Review of Systems: Review of Systems  Constitutional: Negative for chills and fever.  Respiratory: Negative for shortness of breath.   Cardiovascular: Negative for chest pain.  Gastrointestinal: Negative for nausea and vomiting.  Skin: Negative for rash.    Physical Exam BP 122/85   Pulse 98   Temp 98.5 F (36.9 C)   Ht 5\' 6"  (1.676 m)   Wt 178 lb (80.7 kg)   LMP 11/29/2017 (Exact Date)   SpO2 97%   BMI 28.73 kg/m  CONSTITUTIONAL: No acute distress BREAST:  Exam deferred today.  Labs/Imaging: Bilateral breast MRI 06/28/19: FINDINGS: Breast composition: c. Heterogeneous fibroglandular tissue.  Background parenchymal enhancement: Marked  Right breast: Multiple foci are seen throughout the right breast. There is also non mass enhancement in the right breast with 3 main components. The most anterior component is seen superiorly and medially on series 11, image 16 measuring 10 mm. The mid component is identified  at [place] as seen on series 11, image 57 spanning 7 mm. The final component is seen at 12 o'clock in the subareolar region at a mid depth as seen on series 11, image 50 measuring 13 mm in greatest dimension. The total span is 2.6 cm in cranial caudal dimension, 3.6 cm in AP dimension and approximately 10 mm in greatest transverse dimension. No other discrete lesions are regions of suspicious non mass enhancement are identified.  Left breast: Multiple foci are scattered throughout the left breast with no discrete mass or cause  for discharge identified.  Lymph nodes: No abnormal lymph nodes are identified.  Ancillary findings:  None.  IMPRESSION: 1. Nonspecific non mass enhancement is identified with 3 main components as described above. The total dimension encompassing all 3 components is 2.6 x 3.6 by 1.0 cm in craniocaudal, AP, and transverse dimensions respectively. 2. No discrete abnormalities on the left are identified to explain the patient's nipple discharge. 3. The study is limited due to marked background enhancement.  RECOMMENDATION: Recommend MRI guided biopsy of the non mass enhancement in the right breast. Recommend biopsying the most anterior superior and posteroinferior components of this non mass enhancement. Recommend surgical consultation for further management/monitoring of the left nipple discharge and for follow-up of the small red papule on the left nipple.  BI-RADS CATEGORY  4: Suspicious.  Assessment and Plan: This is a 43 y.o. female with left breast nipple discharge, and new suspicious findings on the right breast on MRI.  Discussed with the patient that given the new findings, I would agree with the recommendations to pursue biopsy.  We will place order for MRI-guided biopsy.  Discussed with the patient that currently these are only done at the breast center in [place] and not here at [facility].  She's ok with going to [facility] for her biopsy.  Once the biopsy is done, we'll have her come back to follow up with me to discuss the findings and if any surgery is warranted on the right breast, and to discuss surgery for the left nipple discharge.  Face-to-face time spent with the patient and care providers was 15 minutes, with more than 50% of the time spent counseling, educating, and coordinating care of the patient.     [name], [facility]

## 2019-07-09 ENCOUNTER — Other Ambulatory Visit: Payer: Self-pay | Admitting: Surgery

## 2019-07-09 ENCOUNTER — Telehealth: Payer: Self-pay | Admitting: *Deleted

## 2019-07-09 DIAGNOSIS — R9389 Abnormal findings on diagnostic imaging of other specified body structures: Secondary | ICD-10-CM

## 2019-07-09 NOTE — Telephone Encounter (Signed)
Patient called the office stating that she has not received a phone call about breast MRI biopsy.   The patient reports she did call the [facility] but they told her they have not received the order.   I did call the Breast Center and was able to get the patient scheduled for a right breast MRI guided biopsy for 07-18-19 at 8 am. Patient will report to the [place] location. Prep: no lotions, powders, or deodorant, light eating/drinking, must wear a mask, and only the patient is allowed in the building-no visitors.   A follow up appointment has been scheduled for 07-25-19 at 10:30 am with Dr. Hampton Abbot.   The patient was contacted and notified of the above. Patient verbalizes understanding.   Note routed to [facility], Angie, and Dr. Hampton Abbot.

## 2019-07-18 ENCOUNTER — Ambulatory Visit
Admission: RE | Admit: 2019-07-18 | Discharge: 2019-07-18 | Disposition: A | Discharge status: Home / Self Care | Payer: Medicaid Other | Source: Ambulatory Visit | Attending: Surgery | Admitting: Surgery

## 2019-07-18 ENCOUNTER — Other Ambulatory Visit (HOSPITAL_COMMUNITY): Payer: Self-pay | Admitting: Diagnostic Radiology

## 2019-07-18 ENCOUNTER — Other Ambulatory Visit: Payer: Self-pay

## 2019-07-18 DIAGNOSIS — R9389 Abnormal findings on diagnostic imaging of other specified body structures: Secondary | ICD-10-CM

## 2019-07-18 DIAGNOSIS — N6011 Diffuse cystic mastopathy of right breast: Secondary | ICD-10-CM | POA: Diagnosis not present

## 2019-07-18 DIAGNOSIS — N6311 Unspecified lump in the right breast, upper outer quadrant: Secondary | ICD-10-CM | POA: Diagnosis not present

## 2019-07-18 DIAGNOSIS — N6489 Other specified disorders of breast: Secondary | ICD-10-CM | POA: Diagnosis not present

## 2019-07-18 MED ORDER — GADOBUTROL 1 MMOL/ML IV SOLN
8.0000 mL | Freq: Once | INTRAVENOUS | Status: AC | PRN
  Administered 2019-07-18: 8 mL via INTRAVENOUS

## 2019-07-25 ENCOUNTER — Other Ambulatory Visit: Payer: Self-pay

## 2019-07-25 ENCOUNTER — Ambulatory Visit: Payer: Medicaid Other | Admitting: Surgery

## 2019-07-25 ENCOUNTER — Encounter: Payer: Self-pay | Admitting: Surgery

## 2019-07-25 VITALS — BP 123/84 | HR 91 | Temp 97.5°F | Resp 12 | Ht 66.0 in | Wt 178.8 lb

## 2019-07-25 DIAGNOSIS — N6452 Nipple discharge: Secondary | ICD-10-CM | POA: Diagnosis not present

## 2019-07-25 NOTE — Patient Instructions (Signed)
We will schedule you for a breast duct excision at [facility] with Dr.Piscoya.

## 2019-07-25 NOTE — Progress Notes (Signed)
07/25/2019  History of Present Illness: Melinda Olsen is a 43 y.o. female presenting for follow up of left breast nipple discharge.  She had a bilateral breast MRI on 9/3 which found non-specific non-mass enhancement areas on the right breast which were biopsied on 07/18/19.  Pathology revealed benign findings.  Now she presents to discuss the left breast discharge.  She reports some soreness at the right breast biopsy sites with bruising on one of them.  Still describes soreness of the left breast with intermittent drainage.    Past Medical History: Past Medical History:  Diagnosis Date  . Anxiety    NO MEDS  . Asthma    WELL CONTROLLED  . Depression    NO MEDS  . Dyspnea    PT STATES CHRONIC SOB (SMOKER) BUT STATES SHE CAN WALK A MILE WITHOUT GETTING SOB OR HAVING CHEST PAIN  . Endometriosis   . Family history of adverse reaction to anesthesia    DAD-CAN HEAR SURGEONS TALKING DURING SURGERY BUT NEVER FEELS ANYTHING  . Seizures ([facility]) 2016   X2 -NO MEDS     Past Surgical History: Past Surgical History:  Procedure Laterality Date  . CESAREAN SECTION     X2  . CYSTOSCOPY  12/22/2017   Procedure: CYSTOSCOPY;  Surgeon: Harris, Robert P, MD;  Location: [facility] ORS;  Service: Gynecology;;  . LAPAROSCOPIC HYSTERECTOMY Bilateral 12/22/2017   Procedure: HYSTERECTOMY TOTAL LAPAROSCOPIC BILATERAL SALPINGECTOMY;  Surgeon: Harris, Robert P, MD;  Location: [facility] ORS;  Service: Gynecology;  Laterality: Bilateral;  . TUBAL LIGATION      Home Medications: Prior to Admission medications   Medication Sig Start Date End Date Taking? Authorizing Provider  albuterol (PROVENTIL HFA;VENTOLIN HFA) 108 (90 Base) MCG/ACT inhaler Inhale 2 puffs into the lungs every 4 (four) hours as needed for wheezing or shortness of breath. 11/23/18  Yes Cuthriell, Jonathan D, PA-C  ibuprofen (ADVIL) 200 MG tablet Take 200 mg by mouth every 6 (six) hours as needed.   Yes [provider]    Allergies: No Known  Allergies  Review of Systems: Review of Systems  Constitutional: Negative for chills and fever.  Respiratory: Negative for shortness of breath.   Cardiovascular: Negative for chest pain.  Gastrointestinal: Negative for abdominal pain, nausea and vomiting.  Skin:       Bruise at right breast biopsy site, soreness left breast with drainage.    Physical Exam BP 123/84   Pulse 91   Temp (!) 97.5 F (36.4 C) (Temporal)   Resp 12   Ht 5' 6" (1.676 m)   Wt 178 lb 12.8 oz (81.1 kg)   LMP 11/29/2017 (Exact Date)   SpO2 97%   BMI 28.86 kg/m  CONSTITUTIONAL: No acute distress HEENT:  Normocephalic, atraumatic, extraocular motion intact. RESPIRATORY:  Lungs are clear, and breath sounds are equal bilaterally. Normal respiratory effort without pathologic use of accessory muscles. CARDIOVASCULAR: Heart is regular without murmurs, gallops, or rubs. BREAST:  Right breast with two biopys sites healing well.  There is mild ecchymosis at the more anterior site, with very small hematoma palpable.  Left breast without any palpable masses and with small amount of drainage on manual expression, coming from a central duct. NEUROLOGIC:  Motor and sensation is grossly normal.  Cranial nerves are grossly intact. PSYCH:  Alert and oriented to person, place and time. Affect is normal.  Labs/Imaging: MRI breast 06/28/19: IMPRESSION: 1. Nonspecific non mass enhancement is identified with 3 main components as described above. The   total dimension encompassing all 3 components is 2.6 x 3.6 by 1.0 cm in craniocaudal, AP, and transverse dimensions respectively. 2. No discrete abnormalities on the left are identified to explain the patient's nipple discharge. 3. The study is limited due to marked background enhancement.   Pathology 07/18/19: 1. Breast, right, needle core biopsy, central upper outer - FIBROCYSTIC CHANGES. - THERE IS NO EVIDENCE OF MALIGNANCY.  2. Breast, right, needle core biopsy,  mid-posterior central - FIBROCYSTIC CHANGES WITH ADENOSIS. - PSEUDOANGIOMATOUS STROMAL HYPERPLASIA ([place]). - THERE IS NO EVIDENCE OF MALIGNANCY.  Assessment and Plan: This is a 43 y.o. female with left breast nipple discharge.  Discussed with the patient the benign findings on the right breast biopsies.  Now we can focus on the left breast.  No mass or area of distortion has been found on the left breast via mammogram, ultrasound, or MRI.  Discussed with her that we can take her to the OR and try to place a small probe into the duct that is draining and perform a duct excision in the OR via a periareolar incision.  There is a chance that I am not able to cannulate the duct.  Discussed the risks of bleeding, infection, injury to surrounding structures, and possible ischemia to the nipple.  She is willing to proceed.  We will schedule her for 08/01/19.  She understands that she would need COVID-19 test prior to surgery.  Face-to-face time spent with the patient and care providers was 25 minutes, with more than 50% of the time spent counseling, educating, and coordinating care of the patient.     [name], [facility]

## 2019-07-25 NOTE — H&P (View-Only) (Signed)
07/25/2019  History of Present Illness: Melinda Olsen is a 43 y.o. female presenting for follow up of left breast nipple discharge.  She had a bilateral breast MRI on 9/3 which found non-specific non-mass enhancement areas on the right breast which were biopsied on 07/18/19.  Pathology revealed benign findings.  Now she presents to discuss the left breast discharge.  She reports some soreness at the right breast biopsy sites with bruising on one of them.  Still describes soreness of the left breast with intermittent drainage.    Past Medical History: Past Medical History:  Diagnosis Date  . Anxiety    NO MEDS  . Asthma    WELL CONTROLLED  . Depression    NO MEDS  . Dyspnea    PT STATES CHRONIC SOB (SMOKER) BUT STATES SHE CAN WALK A MILE WITHOUT GETTING SOB OR HAVING CHEST PAIN  . Endometriosis   . Family history of adverse reaction to anesthesia    DAD-CAN HEAR SURGEONS TALKING DURING SURGERY BUT NEVER FEELS ANYTHING  . Seizures (HCC) 2016   X2 -NO MEDS     Past Surgical History: Past Surgical History:  Procedure Laterality Date  . CESAREAN SECTION     X2  . CYSTOSCOPY  12/22/2017   Procedure: CYSTOSCOPY;  Surgeon: Nadara Mustard, MD;  Location: [facility] ORS;  Service: Gynecology;;  . LAPAROSCOPIC HYSTERECTOMY Bilateral 12/22/2017   Procedure: HYSTERECTOMY TOTAL LAPAROSCOPIC BILATERAL SALPINGECTOMY;  Surgeon: Nadara Mustard, MD;  Location: [facility] ORS;  Service: Gynecology;  Laterality: Bilateral;  . TUBAL LIGATION      Home Medications: Prior to Admission medications   Medication Sig Start Date End Date Taking? Authorizing Provider  albuterol (PROVENTIL HFA;VENTOLIN HFA) 108 (90 Base) MCG/ACT inhaler Inhale 2 puffs into the lungs every 4 (four) hours as needed for wheezing or shortness of breath. 11/23/18  Yes Cuthriell, Delorise Royals, PA-C  ibuprofen (ADVIL) 200 MG tablet Take 200 mg by mouth every 6 (six) hours as needed.   Yes [provider]    Allergies: No Known  Allergies  Review of Systems: Review of Systems  Constitutional: Negative for chills and fever.  Respiratory: Negative for shortness of breath.   Cardiovascular: Negative for chest pain.  Gastrointestinal: Negative for abdominal pain, nausea and vomiting.  Skin:       Bruise at right breast biopsy site, soreness left breast with drainage.    Physical Exam BP 123/84   Pulse 91   Temp (!) 97.5 F (36.4 C) (Temporal)   Resp 12   Ht 5\' 6"  (1.676 m)   Wt 178 lb 12.8 oz (81.1 kg)   LMP 11/29/2017 (Exact Date)   SpO2 97%   BMI 28.86 kg/m  CONSTITUTIONAL: No acute distress HEENT:  Normocephalic, atraumatic, extraocular motion intact. RESPIRATORY:  Lungs are clear, and breath sounds are equal bilaterally. Normal respiratory effort without pathologic use of accessory muscles. CARDIOVASCULAR: Heart is regular without murmurs, gallops, or rubs. BREAST:  Right breast with two biopys sites healing well.  There is mild ecchymosis at the more anterior site, with very small hematoma palpable.  Left breast without any palpable masses and with small amount of drainage on manual expression, coming from a central duct. NEUROLOGIC:  Motor and sensation is grossly normal.  Cranial nerves are grossly intact. PSYCH:  Alert and oriented to person, place and time. Affect is normal.  Labs/Imaging: MRI breast 06/28/19: IMPRESSION: 1. Nonspecific non mass enhancement is identified with 3 main components as described above. The  total dimension encompassing all 3 components is 2.6 x 3.6 by 1.0 cm in craniocaudal, AP, and transverse dimensions respectively. 2. No discrete abnormalities on the left are identified to explain the patient's nipple discharge. 3. The study is limited due to marked background enhancement.   Pathology 07/18/19: 1. Breast, right, needle core biopsy, central upper outer - FIBROCYSTIC CHANGES. - THERE IS NO EVIDENCE OF MALIGNANCY.  2. Breast, right, needle core biopsy,  mid-posterior central - FIBROCYSTIC CHANGES WITH ADENOSIS. - PSEUDOANGIOMATOUS STROMAL HYPERPLASIA ([place]). - THERE IS NO EVIDENCE OF MALIGNANCY.  Assessment and Plan: This is a 43 y.o. female with left breast nipple discharge.  Discussed with the patient the benign findings on the right breast biopsies.  Now we can focus on the left breast.  No mass or area of distortion has been found on the left breast via mammogram, ultrasound, or MRI.  Discussed with her that we can take her to the OR and try to place a small probe into the duct that is draining and perform a duct excision in the OR via a periareolar incision.  There is a chance that I am not able to cannulate the duct.  Discussed the risks of bleeding, infection, injury to surrounding structures, and possible ischemia to the nipple.  She is willing to proceed.  We will schedule her for 08/01/19.  She understands that she would need COVID-19 test prior to surgery.  Face-to-face time spent with the patient and care providers was 25 minutes, with more than 50% of the time spent counseling, educating, and coordinating care of the patient.     [name], [facility]

## 2019-07-27 ENCOUNTER — Telehealth: Payer: Self-pay | Admitting: Surgery

## 2019-07-27 ENCOUNTER — Other Ambulatory Visit: Payer: Self-pay

## 2019-07-27 ENCOUNTER — Other Ambulatory Visit
Admission: RE | Admit: 2019-07-27 | Discharge: 2019-07-27 | Disposition: A | Discharge status: Home / Self Care | Payer: Medicaid Other | Source: Ambulatory Visit | Attending: Surgery | Admitting: Surgery

## 2019-07-27 DIAGNOSIS — N6452 Nipple discharge: Secondary | ICD-10-CM | POA: Diagnosis not present

## 2019-07-27 DIAGNOSIS — Z01812 Encounter for preprocedural laboratory examination: Secondary | ICD-10-CM | POA: Diagnosis not present

## 2019-07-27 DIAGNOSIS — Z20828 Contact with and (suspected) exposure to other viral communicable diseases: Secondary | ICD-10-CM | POA: Insufficient documentation

## 2019-07-27 LAB — SARS CORONAVIRUS 2 (TAT 6-24 HRS): SARS Coronavirus 2: NEGATIVE

## 2019-07-27 NOTE — Telephone Encounter (Signed)
Pt has been advised of pre admission date/time, Covid Testing date and Surgery date.  Surgery Date: 08/01/19 with Dr Jordan Likes breast ductal excision.  Preadmission Testing Date: 07/30/19 between 8-1:00pm-phone interview.  Covid Testing Date: 07/27/19 - patient advised to go to the [place] ([place]  [employer] Video sent via [employer] [facility] and [employer].  Patient has been made aware to call [phone], between 1-3:00pm the day before surgery, to find out what time to arrive.

## 2019-07-30 ENCOUNTER — Other Ambulatory Visit: Payer: Self-pay

## 2019-07-30 ENCOUNTER — Encounter
Admission: RE | Admit: 2019-07-30 | Discharge: 2019-07-30 | Disposition: A | Discharge status: Home / Self Care | Payer: Medicaid Other | Source: Ambulatory Visit | Attending: Surgery | Admitting: Surgery

## 2019-07-30 NOTE — Patient Instructions (Signed)
Your procedure is scheduled on: 08/01/2019 Wed Report to Same Day Surgery 2nd floor medical mall [facility] Entrance-take elevator on left to 2nd floor.  Check in with surgery information desk.) To find out your arrival time please call [phone] between 1PM - 3PM on 07/31/2019 Tues  Remember: Instructions that are not followed completely may result in serious medical risk, up to and including death, or upon the discretion of your surgeon and anesthesiologist your surgery may need to be rescheduled.    _x___ 1. Do not eat food after midnight the night before your procedure. You may drink clear liquids up to 2 hours before you are scheduled to arrive at the hospital for your procedure.  Do not drink clear liquids within 2 hours of your scheduled arrival to the hospital.  Clear liquids include  --Water or Apple juice without pulp  --Clear carbohydrate beverage such as ClearFast or Gatorade  --Black Coffee or Clear Tea (No milk, no creamers, do not add anything to                  the coffee or Tea Type 1 and type 2 diabetics should only drink water.   ____Ensure clear carbohydrate drink on the way to the hospital for bariatric patients  ____Ensure clear carbohydrate drink 3 hours before surgery.   No gum chewing or hard candies.     __x__ 2. No Alcohol for 24 hours before or after surgery.   __x__3. No Smoking or e-cigarettes for 24 prior to surgery.  Do not use any chewable tobacco products for at least 6 hour prior to surgery   ____  4. Bring all medications with you on the day of surgery if instructed.    __x__ 5. Notify your doctor if there is any change in your medical condition     (cold, fever, infections).    x___6. On the morning of surgery brush your teeth with toothpaste and water.  You may rinse your mouth with mouth wash if you wish.  Do not swallow any toothpaste or mouthwash.   Do not wear jewelry, make-up, hairpins, clips or nail polish.  Do not wear lotions,  powders, or perfumes. You may wear deodorant.  Do not shave 48 hours prior to surgery. Men may shave face and neck.  Do not bring valuables to the hospital.    [facility] is not responsible for any belongings or valuables.               Contacts, dentures or bridgework may not be worn into surgery.  Leave your suitcase in the car. After surgery it may be brought to your room.  For patients admitted to the hospital, discharge time is determined by your                       treatment team.  _  Patients discharged the day of surgery will not be allowed to drive home.  You will need someone to drive you home and stay with you the night of your procedure.    Please read over the following fact sheets that you were given:   [facility] Preparing for Surgery and or MRSA Information   _x___ Take anti-hypertensive listed below, cardiac, seizure, asthma,     anti-reflux and psychiatric medicines. These include:  1. albuterol (PROVENTIL HFA;VENTOLIN HFA) 108 (90 Base) MCG/ACT inhaler as needed  2.  3.  4.  5.  6.  ____Fleets enema or Magnesium  Citrate as directed.   _x___ Use dial Soap or sage wipes as directed on instruction sheet   ____ Use inhalers on the day of surgery and bring to hospital day of surgery  ____ Stop Metformin and Janumet 2 days prior to surgery.    ____ Take 1/2 of usual insulin dose the night before surgery and none on the morning     surgery.   _x___ Follow recommendations from Cardiologist, Pulmonologist or PCP regarding          stopping Aspirin, Coumadin, Plavix ,Eliquis, Effient, or Pradaxa, and Pletal.  X____Stop Anti-inflammatories such as Advil, Aleve, Ibuprofen, Motrin, Naproxen, Naprosyn, Goodies powders or aspirin products. OK to take Tylenol and                          Celebrex.   _x___ Stop supplements until after surgery.  But may continue Vitamin D, Vitamin B,       and multivitamin.   ____ Bring C-Pap to the hospital.

## 2019-07-31 MED ORDER — CEFAZOLIN SODIUM-DEXTROSE 2-4 GM/100ML-% IV SOLN
2.0000 g | INTRAVENOUS | Status: AC
  Administered 2019-08-01: 2 g via INTRAVENOUS

## 2019-08-01 ENCOUNTER — Ambulatory Visit: Payer: Medicaid Other | Admitting: Anesthesiology

## 2019-08-01 ENCOUNTER — Encounter: Payer: Self-pay | Admitting: *Deleted

## 2019-08-01 ENCOUNTER — Ambulatory Visit
Admission: RE | Admit: 2019-08-01 | Discharge: 2019-08-01 | Disposition: A | Discharge status: Home / Self Care | Payer: Medicaid Other | Attending: Surgery | Admitting: Surgery

## 2019-08-01 ENCOUNTER — Encounter
Admission: RE | Disposition: A | Discharge status: Home / Self Care | Payer: Self-pay | Source: Home / Self Care | Attending: Surgery

## 2019-08-01 ENCOUNTER — Other Ambulatory Visit: Payer: Self-pay

## 2019-08-01 DIAGNOSIS — N6452 Nipple discharge: Secondary | ICD-10-CM | POA: Diagnosis not present

## 2019-08-01 DIAGNOSIS — N6042 Mammary duct ectasia of left breast: Secondary | ICD-10-CM | POA: Diagnosis not present

## 2019-08-01 HISTORY — PX: BREAST DUCTAL SYSTEM EXCISION: SHX5242

## 2019-08-01 SURGERY — EXCISION DUCTAL SYSTEM BREAST
Anesthesia: General | Laterality: Left

## 2019-08-01 MED ORDER — PROMETHAZINE HCL 25 MG/ML IJ SOLN
6.2500 mg | Freq: Once | INTRAMUSCULAR | Status: AC
  Administered 2019-08-01: 15:00:00 6.25 mg via INTRAVENOUS

## 2019-08-01 MED ORDER — DEXMEDETOMIDINE HCL IN NACL 80 MCG/20ML IV SOLN
INTRAVENOUS | Status: AC
  Filled 2019-08-01: qty 20

## 2019-08-01 MED ORDER — ACETAMINOPHEN 500 MG PO TABS
1000.0000 mg | ORAL_TABLET | ORAL | Status: AC
  Administered 2019-08-01: 13:00:00 1000 mg via ORAL

## 2019-08-01 MED ORDER — FENTANYL CITRATE (PF) 100 MCG/2ML IJ SOLN
INTRAMUSCULAR | Status: AC
  Filled 2019-08-01: qty 2

## 2019-08-01 MED ORDER — MIDAZOLAM HCL 2 MG/2ML IJ SOLN
INTRAMUSCULAR | Status: AC
  Filled 2019-08-01: qty 2

## 2019-08-01 MED ORDER — FAMOTIDINE 20 MG PO TABS
20.0000 mg | ORAL_TABLET | Freq: Once | ORAL | Status: AC
  Administered 2019-08-01: 13:00:00 20 mg via ORAL

## 2019-08-01 MED ORDER — BUPIVACAINE-EPINEPHRINE (PF) 0.5% -1:200000 IJ SOLN
INTRAMUSCULAR | Status: AC
  Filled 2019-08-01: qty 30

## 2019-08-01 MED ORDER — ACETAMINOPHEN 500 MG PO TABS
ORAL_TABLET | ORAL | Status: AC
  Administered 2019-08-01: 13:00:00 1000 mg via ORAL
  Filled 2019-08-01: qty 2

## 2019-08-01 MED ORDER — DEXAMETHASONE SODIUM PHOSPHATE 10 MG/ML IJ SOLN
INTRAMUSCULAR | Status: AC
  Filled 2019-08-01: qty 2

## 2019-08-01 MED ORDER — BUPIVACAINE HCL 0.5 % IJ SOLN
INTRAMUSCULAR | Status: DC | PRN
  Administered 2019-08-01: 30 mL

## 2019-08-01 MED ORDER — MIDAZOLAM HCL 2 MG/2ML IJ SOLN
INTRAMUSCULAR | Status: DC | PRN
  Administered 2019-08-01: 2 mg via INTRAVENOUS

## 2019-08-01 MED ORDER — LIDOCAINE HCL (CARDIAC) PF 100 MG/5ML IV SOSY
PREFILLED_SYRINGE | INTRAVENOUS | Status: DC | PRN
  Administered 2019-08-01: 100 mg via INTRAVENOUS

## 2019-08-01 MED ORDER — METHYLENE BLUE 0.5 % INJ SOLN
INTRAVENOUS | Status: AC
  Filled 2019-08-01: qty 10

## 2019-08-01 MED ORDER — ONDANSETRON HCL 4 MG/2ML IJ SOLN
INTRAMUSCULAR | Status: AC
  Filled 2019-08-01: qty 8

## 2019-08-01 MED ORDER — BUPIVACAINE HCL (PF) 0.5 % IJ SOLN
INTRAMUSCULAR | Status: AC
  Filled 2019-08-01: qty 30

## 2019-08-01 MED ORDER — FENTANYL CITRATE (PF) 100 MCG/2ML IJ SOLN
INTRAMUSCULAR | Status: DC | PRN
  Administered 2019-08-01 (×4): 25 ug via INTRAVENOUS

## 2019-08-01 MED ORDER — IBUPROFEN 600 MG PO TABS: 600.0000 mg | ORAL_TABLET | Freq: Three times a day (TID) | ORAL | 0 refills | Status: DC | PRN

## 2019-08-01 MED ORDER — SODIUM CHLORIDE FLUSH 0.9 % IV SOLN
INTRAVENOUS | Status: AC
  Filled 2019-08-01: qty 10

## 2019-08-01 MED ORDER — ONDANSETRON HCL 4 MG/2ML IJ SOLN: 4.0000 mg | Freq: Once | INTRAMUSCULAR | Status: DC | PRN

## 2019-08-01 MED ORDER — FAMOTIDINE 20 MG PO TABS
ORAL_TABLET | ORAL | Status: AC
  Administered 2019-08-01: 13:00:00 20 mg via ORAL
  Filled 2019-08-01: qty 1

## 2019-08-01 MED ORDER — FENTANYL CITRATE (PF) 100 MCG/2ML IJ SOLN
25.0000 ug | INTRAMUSCULAR | Status: DC | PRN
  Administered 2019-08-01 (×2): 25 ug via INTRAVENOUS

## 2019-08-01 MED ORDER — OXYCODONE HCL 5 MG PO TABS: 5.0000 mg | ORAL_TABLET | ORAL | 0 refills | Status: DC | PRN

## 2019-08-01 MED ORDER — DEXMEDETOMIDINE HCL IN NACL 200 MCG/50ML IV SOLN
INTRAVENOUS | Status: DC | PRN
  Administered 2019-08-01 (×2): 4 ug via INTRAVENOUS

## 2019-08-01 MED ORDER — CHLORHEXIDINE GLUCONATE CLOTH 2 % EX PADS: 6.0000 | MEDICATED_PAD | Freq: Once | CUTANEOUS | Status: DC

## 2019-08-01 MED ORDER — GLYCOPYRROLATE 0.2 MG/ML IJ SOLN
INTRAMUSCULAR | Status: AC
  Filled 2019-08-01: qty 2

## 2019-08-01 MED ORDER — PROMETHAZINE HCL 25 MG/ML IJ SOLN
INTRAMUSCULAR | Status: AC
  Filled 2019-08-01: qty 1

## 2019-08-01 MED ORDER — DEXAMETHASONE SODIUM PHOSPHATE 10 MG/ML IJ SOLN
INTRAMUSCULAR | Status: DC | PRN
  Administered 2019-08-01: 10 mg via INTRAVENOUS

## 2019-08-01 MED ORDER — GABAPENTIN 300 MG PO CAPS
ORAL_CAPSULE | ORAL | Status: AC
  Administered 2019-08-01: 13:00:00 300 mg via ORAL
  Filled 2019-08-01: qty 1

## 2019-08-01 MED ORDER — LACTATED RINGERS IV SOLN
INTRAVENOUS | Status: DC
  Administered 2019-08-01 (×2): via INTRAVENOUS

## 2019-08-01 MED ORDER — GABAPENTIN 300 MG PO CAPS
300.0000 mg | ORAL_CAPSULE | ORAL | Status: AC
  Administered 2019-08-01: 13:00:00 300 mg via ORAL

## 2019-08-01 MED ORDER — PROPOFOL 10 MG/ML IV BOLUS
INTRAVENOUS | Status: DC | PRN
  Administered 2019-08-01: 150 mg via INTRAVENOUS
  Administered 2019-08-01: 50 mg via INTRAVENOUS

## 2019-08-01 MED ORDER — CEFAZOLIN SODIUM-DEXTROSE 2-4 GM/100ML-% IV SOLN
INTRAVENOUS | Status: AC
  Filled 2019-08-01: qty 100

## 2019-08-01 MED ORDER — ONDANSETRON HCL 4 MG/2ML IJ SOLN
INTRAMUSCULAR | Status: DC | PRN
  Administered 2019-08-01: 4 mg via INTRAVENOUS

## 2019-08-01 MED ORDER — LIDOCAINE HCL (PF) 2 % IJ SOLN
INTRAMUSCULAR | Status: AC
  Filled 2019-08-01: qty 20

## 2019-08-01 MED ORDER — GLYCOPYRROLATE 0.2 MG/ML IJ SOLN
INTRAMUSCULAR | Status: DC | PRN
  Administered 2019-08-01: 0.2 mg via INTRAVENOUS

## 2019-08-01 SURGICAL SUPPLY — 45 items
APPLIER CLIP 9.375 SM OPEN (CLIP)
BINDER BREAST LRG (GAUZE/BANDAGES/DRESSINGS) IMPLANT
BINDER BREAST MEDIUM (GAUZE/BANDAGES/DRESSINGS) IMPLANT
BLADE PHOTON ILLUMINATED (MISCELLANEOUS) ×3 IMPLANT
BLADE SURG 15 STRL LF DISP TIS (BLADE) ×2 IMPLANT
BLADE SURG 15 STRL SS (BLADE) ×4
CANISTER SUCT 1200ML W/VALVE (MISCELLANEOUS) ×3 IMPLANT
CHLORAPREP W/TINT 26 (MISCELLANEOUS) ×3 IMPLANT
CLIP APPLIE 9.375 SM OPEN (CLIP) IMPLANT
CNTNR SPEC 2.5X3XGRAD LEK (MISCELLANEOUS) ×2
CONT SPEC 4OZ STER OR WHT (MISCELLANEOUS) ×4
CONTAINER SPEC 2.5X3XGRAD LEK (MISCELLANEOUS) ×2 IMPLANT
COVER PROBE FLX POLY STRL (MISCELLANEOUS) ×3 IMPLANT
COVER WAND RF STERILE (DRAPES) IMPLANT
DERMABOND ADVANCED (GAUZE/BANDAGES/DRESSINGS) ×2
DERMABOND ADVANCED .7 DNX12 (GAUZE/BANDAGES/DRESSINGS) ×1 IMPLANT
DEVICE DUBIN SPECIMEN MAMMOGRA (MISCELLANEOUS) ×3 IMPLANT
DRAPE LAPAROTOMY 100X77 ABD (DRAPES) ×3 IMPLANT
DRSG GAUZE FLUFF 36X18 (GAUZE/BANDAGES/DRESSINGS) ×3 IMPLANT
ELECT CAUTERY BLADE 6.4 (BLADE) ×3 IMPLANT
ELECT REM PT RETURN 9FT ADLT (ELECTROSURGICAL) ×3
ELECTRODE REM PT RTRN 9FT ADLT (ELECTROSURGICAL) ×1 IMPLANT
GLOVE SURG SYN 7.0 (GLOVE) ×3 IMPLANT
GLOVE SURG SYN 7.5  E (GLOVE) ×2
GLOVE SURG SYN 7.5 E (GLOVE) ×1 IMPLANT
GOWN STRL REUS W/ TWL LRG LVL3 (GOWN DISPOSABLE) ×2 IMPLANT
GOWN STRL REUS W/TWL LRG LVL3 (GOWN DISPOSABLE) ×4
KIT TURNOVER KIT A (KITS) ×3 IMPLANT
LABEL OR SOLS (LABEL) ×3 IMPLANT
NEEDLE HYPO 22GX1.5 SAFETY (NEEDLE) ×3 IMPLANT
NEEDLE HYPO 25X1 1.5 SAFETY (NEEDLE) ×3 IMPLANT
PACK BASIN MINOR ARMC (MISCELLANEOUS) ×3 IMPLANT
SLEVE PROBE SENORX GAMMA FIND (MISCELLANEOUS) IMPLANT
SUT ETHILON 3-0 FS-10 30 BLK (SUTURE) ×3
SUT MNCRL 4-0 (SUTURE) ×2
SUT MNCRL 4-0 27XMFL (SUTURE) ×1
SUT SILK 3 0 SH 30 (SUTURE) ×3 IMPLANT
SUT VIC AB 3-0 SH 27 (SUTURE)
SUT VIC AB 3-0 SH 27X BRD (SUTURE) IMPLANT
SUTURE EHLN 3-0 FS-10 30 BLK (SUTURE) ×1 IMPLANT
SUTURE MNCRL 4-0 27XMF (SUTURE) ×1 IMPLANT
SYR 10ML LL (SYRINGE) ×3 IMPLANT
SYR BULB IRRIG 60ML STRL (SYRINGE) ×3 IMPLANT
TAPE TRANSPORE STRL 2 31045 (GAUZE/BANDAGES/DRESSINGS) IMPLANT
WATER STERILE IRR 1000ML POUR (IV SOLUTION) ×3 IMPLANT

## 2019-08-01 NOTE — Anesthesia Procedure Notes (Signed)
Procedure Name: LMA Insertion Performed by: Kelton Pillar, CRNA Pre-anesthesia Checklist: Patient identified, Emergency Drugs available, Suction available and Patient being monitored Patient Re-evaluated:Patient Re-evaluated prior to induction Oxygen Delivery Method: Circle system utilized Preoxygenation: Pre-oxygenation with 100% oxygen Induction Type: IV induction LMA: LMA inserted LMA Size: 4.0 Number of attempts: 1 Placement Confirmation: positive ETCO2,  CO2 detector and breath sounds checked- equal and bilateral Tube secured with: Tape Dental Injury: Teeth and Oropharynx as per pre-operative assessment  Comments: LMA #3 inserted w/o incident, atraumatic, change to LMA #4 w/o incident d/t leak, atraumatic TFH

## 2019-08-01 NOTE — Interval H&P Note (Signed)
History and Physical Interval Note:  08/01/2019 12:26 PM  [name]  has presented today for surgery, with the diagnosis of left nipple discharge.  The various methods of treatment have been discussed with the patient and family. After consideration of risks, benefits and other options for treatment, the patient has consented to  Procedure(s): [place] (Left) as a surgical intervention.  The patient's history has been reviewed, patient examined, no change in status, stable for surgery.  I have reviewed the patient's chart and labs.  Questions were answered to the patient's satisfaction.     Melinda Olsen

## 2019-08-01 NOTE — Discharge Instructions (Signed)

## 2019-08-01 NOTE — Anesthesia Preprocedure Evaluation (Signed)
Anesthesia Evaluation  Patient identified by MRN, date of birth, ID band Patient awake    Reviewed: Allergy & Precautions, NPO status , Patient's Chart, lab work & pertinent test results  History of Anesthesia Complications Negative for: history of anesthetic complications  Airway Mallampati: II       Dental   Pulmonary asthma , neg sleep apnea, neg COPD, Current Smoker,           Cardiovascular (-) hypertension(-) Past MI and (-) CHF (-) dysrhythmias (-) Valvular Problems/Murmurs     Neuro/Psych Seizures - (1 seizure with OD, on no meds for seizures),  Anxiety Depression    GI/Hepatic Neg liver ROS, neg GERD  ,  Endo/Other  neg diabetes  Renal/GU negative Renal ROS     Musculoskeletal   Abdominal   Peds  Hematology   Anesthesia Other Findings   Reproductive/Obstetrics                             Anesthesia Physical Anesthesia Plan  ASA: II  Anesthesia Plan: General   Post-op Pain Management:    Induction: Intravenous  PONV Risk Score and Plan: 2  Airway Management Planned: LMA  Additional Equipment:   Intra-op Plan:   Post-operative Plan:   Informed Consent: I have reviewed the patients History and Physical, chart, labs and discussed the procedure including the risks, benefits and alternatives for the proposed anesthesia with the patient or authorized representative who has indicated his/her understanding and acceptance.       Plan Discussed with:   Anesthesia Plan Comments:         Anesthesia Quick Evaluation

## 2019-08-01 NOTE — Transfer of Care (Signed)
Immediate Anesthesia Transfer of Care Note  Patient: Melinda Olsen  Procedure(s) Performed: EXCISION DUCTAL SYSTEM BREAST (Left )  Patient Location: PACU  Anesthesia Type:General  Level of Consciousness: awake, alert , oriented and patient cooperative  Airway & Oxygen Therapy: Patient Spontanous Breathing and Patient connected to face mask oxygen  Post-op Assessment: Report given to RN and Post -op Vital signs reviewed and stable  Post vital signs: Reviewed and stable  Last Vitals:  Vitals Value Taken Time  BP 106/75 08/01/19 1449  Temp 36.9 C 08/01/19 1449  Pulse 119 08/01/19 1451  Resp 14 08/01/19 1451  SpO2 94 % 08/01/19 1451  Vitals shown include unvalidated device data.  Last Pain:  Vitals:   08/01/19 1449  TempSrc:   PainSc: 8          Complications: No apparent anesthesia complications

## 2019-08-01 NOTE — Op Note (Signed)
  Procedure Date:  08/01/2019  Pre-operative Diagnosis:  Left nipple discharge  Post-operative Diagnosis:  Left nipple discharge  Procedure:  Left breast central duct excision  Surgeon:  [name], MD  Anesthesia:  General endotracheal  Estimated Blood Loss:  5 ml  Specimens:  Central ducts  Complications:  None  Indications for Procedure:  This is a 43 y.o. female who presents with left nipple discharge.  Her workup has been negative including mammogram, ultrasound, and MRI.  The risks of bleeding, infection, injury to surrounding structures, hematoma, seroma, open wound, cosmetic deformity, and the need for further surgery were all discussed with the patient and was willing to proceed.  Description of Procedure: The patient was correctly identified in the preoperative area and brought into the operating room.  The patient was placed supine with VTE prophylaxis in place.  Appropriate time-outs were performed.  Anesthesia was induced and the patient was intubated.  Appropriate antibiotics were infused.  The left chest was prepped and draped in usual sterile fashion.  Manual pressure was applied to the left nipple and there were multiple areas of drainage.  Lacrimal probe was not able to go beyond a few millimeters to better identify specific ducts.  A periareolar 5 cm incision was made over the lateral half of the areola.  Cautery was used to dissect down the subcutaneous tissue and breast tissue.  The central ducts were dissected using blunt dissection.  The ducts were then resected off as a small mass and oriented with 2-0 Silk sutures as short superior and long lateral.  It was sent off to pathology.  The wound cavity was then thoroughly irrigated and hemostasis was assured.  There was no gross ischemia to the nipple.  Local anesthetic was infiltrated into the skin and subcutaneous tissue of the cavity.  The wound was then closed in multiple layers with 3-0 Vicryl and 4-0 Monocryl  and sealed with DermaBond.  The patient was emerged from anesthesia and extubated and brought to the recovery room for further management.  The patient tolerated the procedure well and all counts were correct at the end of the case.   [name], MD

## 2019-08-01 NOTE — Anesthesia Post-op Follow-up Note (Signed)
Anesthesia QCDR form completed.        

## 2019-08-02 ENCOUNTER — Encounter: Payer: Self-pay | Admitting: Surgery

## 2019-08-03 LAB — SURGICAL PATHOLOGY

## 2019-08-03 NOTE — Anesthesia Postprocedure Evaluation (Signed)
Anesthesia Post Note  Patient: Melinda Olsen  Procedure(s) Performed: EXCISION DUCTAL SYSTEM BREAST (Left )  Patient location during evaluation: PACU Anesthesia Type: General Level of consciousness: awake and alert Pain management: pain level controlled Vital Signs Assessment: post-procedure vital signs reviewed and stable Respiratory status: spontaneous breathing and respiratory function stable Cardiovascular status: stable Anesthetic complications: no     Last Vitals:  Vitals:   08/01/19 1535 08/01/19 1555  BP: (!) 131/94 132/88  Pulse: 74 86  Resp: 18 18  Temp: 36.5 C   SpO2: 99% 100%    Last Pain:  Vitals:   08/01/19 1555  TempSrc:   PainSc: 0-No pain                 KEPHART,WILLIAM K

## 2019-08-15 ENCOUNTER — Encounter: Payer: Self-pay | Admitting: Surgery

## 2019-08-15 ENCOUNTER — Other Ambulatory Visit: Payer: Self-pay

## 2019-08-15 ENCOUNTER — Ambulatory Visit (INDEPENDENT_AMBULATORY_CARE_PROVIDER_SITE_OTHER): Payer: Self-pay | Admitting: Surgery

## 2019-08-15 VITALS — BP 128/84 | HR 98 | Temp 97.2°F | Wt 178.0 lb

## 2019-08-15 DIAGNOSIS — N6452 Nipple discharge: Secondary | ICD-10-CM

## 2019-08-15 NOTE — Patient Instructions (Addendum)
The area will continue to improve.  Follow-up with our office as needed.  Please call and ask to speak with a nurse if you develop questions or concerns.  Bilateral breast MRI in March no office visit needed.  Bilateral Diagnostic mammogram in August with office visit.

## 2019-08-15 NOTE — Progress Notes (Signed)
08/15/2019  HPI: Melinda Olsen is a 43 y.o. female s/p left breast central duct excision on 10/7 for left nipple discharge.  Imaging studies had failed to reveal any mass or abnormality.  Patient reports that she's having some soreness still and had some bruising.  She did lift heavy stuff last week because she did not want to ask anyone for help.  Had some mild drainage from the wound but that has stopped 4 days ago.  Denies any pressure or feeling of fullness.  Vital signs: BP 128/84   Pulse 98   Temp (!) 97.2 F (36.2 C)   Wt 178 lb (80.7 kg)   LMP 11/29/2017 (Exact Date)   SpO2 98%   BMI 28.73 kg/m    Physical Exam: Constitutional: No acute distress Breast:  Left breast lateral periareolar incision is healing well, without any breakdown visible and without any drainage.  There is faint ecchymosis that is resolving at the inferior and lateral aspects.  No evidence of infection.    Assessment/Plan: This is a 43 y.o. female s/p left breast central duct excision.  --Patient is healing well, without complications at this point. --Discussed with patient the pathology findings from her excision.  This showed benign breast tissue with duct ectasia, negative for malignancy or atypia.   --Discussed with [facility] and they would like to obtain a bilateral breast MRI in March 2021 to follow up from the benign biopsies of the right breast.  We will called her with the results of the MRI and if there is anything of concern, will set up in office appointment.  Otherwise, she will continue with yearly mammogram cycles.  She will follow up with me in about a year, with bilateral diagnostic mammogram in August 2021.   [name], [facility]

## 2019-08-22 ENCOUNTER — Ambulatory Visit (INDEPENDENT_AMBULATORY_CARE_PROVIDER_SITE_OTHER): Payer: Medicaid Other | Admitting: Family Medicine

## 2019-08-22 ENCOUNTER — Encounter: Payer: Self-pay | Admitting: Family Medicine

## 2019-08-22 ENCOUNTER — Other Ambulatory Visit: Payer: Self-pay

## 2019-08-22 VITALS — BP 129/82 | HR 109 | Temp 96.6°F | Wt 178.0 lb

## 2019-08-22 DIAGNOSIS — F17200 Nicotine dependence, unspecified, uncomplicated: Secondary | ICD-10-CM

## 2019-08-22 DIAGNOSIS — F332 Major depressive disorder, recurrent severe without psychotic features: Secondary | ICD-10-CM | POA: Diagnosis not present

## 2019-08-22 DIAGNOSIS — E782 Mixed hyperlipidemia: Secondary | ICD-10-CM | POA: Insufficient documentation

## 2019-08-22 DIAGNOSIS — M722 Plantar fascial fibromatosis: Secondary | ICD-10-CM | POA: Diagnosis not present

## 2019-08-22 DIAGNOSIS — M5442 Lumbago with sciatica, left side: Secondary | ICD-10-CM | POA: Insufficient documentation

## 2019-08-22 DIAGNOSIS — Z915 Personal history of self-harm: Secondary | ICD-10-CM | POA: Diagnosis not present

## 2019-08-22 DIAGNOSIS — D72829 Elevated white blood cell count, unspecified: Secondary | ICD-10-CM

## 2019-08-22 DIAGNOSIS — Z0000 Encounter for general adult medical examination without abnormal findings: Secondary | ICD-10-CM

## 2019-08-22 DIAGNOSIS — Z9151 Personal history of suicidal behavior: Secondary | ICD-10-CM

## 2019-08-22 MED ORDER — DULOXETINE HCL 30 MG PO CPEP: 30.0000 mg | ORAL_CAPSULE | Freq: Every day | ORAL | 3 refills | Status: DC

## 2019-08-22 MED ORDER — NYSTATIN 100000 UNIT/GM EX OINT: 1.0000 "application " | TOPICAL_OINTMENT | Freq: Two times a day (BID) | CUTANEOUS | 0 refills | Status: DC

## 2019-08-22 MED ORDER — PREDNISONE 20 MG PO TABS: ORAL_TABLET | ORAL | 0 refills | Status: DC

## 2019-08-22 NOTE — Assessment & Plan Note (Signed)
Again discussed the harms of continuing to smoke and the benefits of tobacco cessation Patient remains precontemplative We will continue to encourage cessation at future visits Of note, would not use Zyban to help with tobacco cessation as patient has a history of intentional overdose of Wellbutrin Would also be cautious with Chantix given her history of suicide attempt

## 2019-08-22 NOTE — Patient Instructions (Signed)

## 2019-08-22 NOTE — Assessment & Plan Note (Signed)
Patient states that her symptoms are not well controlled as they were previously She knows the importance of therapy in the treatment of depression and agrees to referral today As above, she is having some passive SI and was contracted for safety We will resume Cymbalta at 30 mg daily as this was previously helpful to her We discussed that it can take 6 to 8 weeks to reach full efficacy We discussed potential side effects including GI upset, sexual dysfunction, worsening SI Follow-up in 6 weeks and consider dose titration Repeat PHQ 9 at next visit

## 2019-08-22 NOTE — Assessment & Plan Note (Addendum)
History of intentional overdose with Wellbutrin She does have passive SI currently, but denies any active plan She was contracted for safety and does not seem to be a danger to herself currently

## 2019-08-22 NOTE — Assessment & Plan Note (Signed)
New problem No red flag symptoms She does have some left-sided sciatica, but is neuro intact on exam Discussed natural course of acute back pain Discussed rest, ice/heat, anti-inflammatories Can start home exercises when she is out of the acute phase No indication for imaging at this time Prednisone burst and taper prescribed Return precautions discussed

## 2019-08-22 NOTE — Progress Notes (Signed)
Patient: Melinda Olsen, Female    DOB: 1976-08-24, 43 y.o.   MRN: [id number] Visit Date: 08/22/2019  Today's Provider: Shirlee Latch, MD   Chief Complaint  Patient presents with  . Annual Exam  . Back Pain    Lower left; started overnight getting out of bed.   Subjective:     Annual physical exam Melinda Olsen is a 43 y.o. female who presents today for health maintenance and complete physical. She feels fairly well. She reports not exercising. She reports she is sleeping poorly.  -----------------------------------------------------------------   Pt would like to discuss restarting Cymbalta for depression.  Pt states she used it in the past and seemed to help.  Having more depression symptoms lately.  Some passive SI, but no plans.  States that taking care of her children is protective (she is a single mom).  States she will never try to commit suicide again after making a promise to her mom before she passed away.  She does not get up and take a shower most days.  She feels overwhelmed by running errands alone.  She is no longer seeing a therapist.   Back Pain This is a new problem. The current episode started yesterday. The problem is unchanged. The pain is present in the lumbar spine. The quality of the pain is described as shooting. The symptoms are aggravated by standing, sitting and coughing. Associated symptoms include weakness (Left leg). Pertinent negatives include no bladder incontinence, bowel incontinence, numbness, paresis or paresthesias. She has tried NSAIDs for the symptoms. The treatment provided no relief.   Numbness of L leg intermittently. She has had this previously.  Recently had breast surgery.  Feeling some sharp pains under nipple now.  No drainage, erythema, swelling.   Review of Systems  Gastrointestinal: Negative for bowel incontinence.  Genitourinary: Negative for bladder incontinence.  Musculoskeletal: Positive for back pain.   Neurological: Positive for weakness (Left leg). Negative for numbness and paresthesias.    Social History      She  reports that she has been smoking cigarettes. She has a 23.00 pack-year smoking history. She has never used smokeless tobacco. She reports current alcohol use of about 2.0 standard drinks of alcohol per week. She reports that she does not use drugs.       Social History   Socioeconomic History  . Marital status: Media planner    Spouse name: Not on file  . Number of children: 2  . Years of education: Not on file  . Highest education level: Not on file  Occupational History  . Occupation: cleaning buildings  Social Needs  . Financial resource strain: Not on file  . Food insecurity    Worry: Not on file    Inability: Not on file  . Transportation needs    Medical: Not on file    Non-medical: Not on file  Tobacco Use  . Smoking status: Current Every Day Smoker    Packs/day: 1.00    Years: 23.00    Pack years: 23.00    Types: Cigarettes  . Smokeless tobacco: Never Used  Substance and Sexual Activity  . Alcohol use: Yes    Alcohol/week: 2.0 standard drinks    Types: 2 Cans of beer per week    Comment: once weekly  . Drug use: No  . Sexual activity: Not Currently    Birth control/protection: Surgical  Lifestyle  . Physical activity    Days per week: Not on  file    Minutes per session: Not on file  . Stress: Not on file  Relationships  . Social Herbalist on phone: Not on file    Gets together: Not on file    Attends religious service: Not on file    Active member of club or organization: Not on file    Attends meetings of clubs or organizations: Not on file    Relationship status: Not on file  Other Topics Concern  . Not on file  Social History Narrative  . Not on file    Past Medical History:  Diagnosis Date  . Anxiety    NO MEDS  . Asthma    WELL CONTROLLED  . Depression    NO MEDS  . Dyspnea    PT STATES CHRONIC SOB (SMOKER)  BUT STATES SHE CAN WALK A MILE WITHOUT GETTING SOB OR HAVING CHEST PAIN  . Endometriosis   . Family history of adverse reaction to anesthesia    DAD-CAN HEAR SURGEONS TALKING DURING SURGERY BUT NEVER FEELS ANYTHING  . Seizures ([place]) 2016   X2 -NO MEDS     Patient Active Problem List   Diagnosis Date Noted  . Discharge from left nipple 06/15/2019  . Bilateral leg cramps 05/25/2019  . Plantar fasciitis 05/25/2019  . Radicular pain in right arm 05/25/2019  . Suprapubic pain 05/25/2019  . Urinary urgency 05/25/2019  . Tobacco use disorder 05/25/2019  . Mild intermittent asthma without complication 36/64/4034  . History of suicide attempt 05/25/2019  . MDD (major depressive disorder) 05/25/2019  . Adenomyosis 12/01/2017  . Dysmenorrhea 11/09/2017  . Galactorrhea 11/09/2017  . Chronic pelvic pain in female 11/09/2017    Past Surgical History:  Procedure Laterality Date  . BREAST DUCTAL SYSTEM EXCISION Left 08/01/2019   Procedure: EXCISION DUCTAL SYSTEM BREAST;  Surgeon: Olean Ree, MD;  Location: [facility] ORS;  Service: General;  Laterality: Left;  . CESAREAN SECTION     X2  . CYSTOSCOPY  12/22/2017   Procedure: CYSTOSCOPY;  Surgeon: Gae Dry, MD;  Location: [facility] ORS;  Service: Gynecology;;  . LAPAROSCOPIC HYSTERECTOMY Bilateral 12/22/2017   Procedure: HYSTERECTOMY TOTAL LAPAROSCOPIC BILATERAL SALPINGECTOMY;  Surgeon: Gae Dry, MD;  Location: [facility] ORS;  Service: Gynecology;  Laterality: Bilateral;  . TUBAL LIGATION      Family History        Family Status  Relation Name Status  . Mother  (Not Specified)  . Ethlyn Daniels  (Not Specified)  . MGF  (Not Specified)  . PGM  (Not Specified)  . Ethlyn Daniels  (Not Specified)        Her family history includes Breast cancer in her paternal aunt; Cervical cancer in her paternal aunt; Diabetes in her mother, paternal aunt, paternal aunt, and paternal grandmother; Heart attack in her paternal grandmother; Lung cancer in her maternal  grandfather and paternal aunt; Throat cancer in her mother.      No Known Allergies   Current Outpatient Medications:  .  albuterol (PROVENTIL HFA;VENTOLIN HFA) 108 (90 Base) MCG/ACT inhaler, Inhale 2 puffs into the lungs every 4 (four) hours as needed for wheezing or shortness of breath., Disp: 1 Inhaler, Rfl: 0 .  ibuprofen (ADVIL) 200 MG tablet, Take 600-800 mg by mouth every 6 (six) hours as needed for moderate pain. , Disp: , Rfl:  .  ibuprofen (ADVIL) 600 MG tablet, Take 1 tablet (600 mg total) by mouth every 8 (eight) hours as needed for mild pain  or moderate pain. (Patient not taking: Reported on 08/22/2019), Disp: 30 tablet, Rfl: 0 .  oxyCODONE (OXY IR/ROXICODONE) 5 MG immediate release tablet, Take 1 tablet (5 mg total) by mouth every 4 (four) hours as needed for severe pain. (Patient not taking: Reported on 08/22/2019), Disp: 30 tablet, Rfl: 0   Patient Care Team: Erasmo Downer, MD as PCP - General (Family Medicine) Jim Like, RN as Registered Nurse Scarlett Presto, RN as Registered Nurse    Objective:    Vitals: BP 129/82 (BP Location: Right Arm, Patient Position: Sitting, Cuff Size: Normal)   Pulse (!) 109   Temp (!) 96.6 F (35.9 C) (Temporal)   Wt 178 lb (80.7 kg)   LMP 11/29/2017 (Exact Date)   BMI 28.73 kg/m    Vitals:   08/22/19 1400  BP: 129/82  Pulse: (!) 109  Temp: (!) 96.6 F (35.9 C)  TempSrc: Temporal  Weight: 178 lb (80.7 kg)     Physical Exam Vitals signs reviewed.  Constitutional:      General: She is not in acute distress.    Appearance: Normal appearance. She is well-developed. She is not diaphoretic.  HENT:     Head: Normocephalic and atraumatic.     Right Ear: Tympanic membrane, ear canal and external ear normal.     Left Ear: Tympanic membrane, ear canal and external ear normal.  Eyes:     General: No scleral icterus.    Conjunctiva/sclera: Conjunctivae normal.     Pupils: Pupils are equal, round, and reactive to light.   Neck:     Musculoskeletal: Neck supple.     Thyroid: No thyromegaly.  Cardiovascular:     Rate and Rhythm: Normal rate and regular rhythm.     Pulses: Normal pulses.     Heart sounds: Normal heart sounds. No murmur.  Pulmonary:     Effort: Pulmonary effort is normal. No respiratory distress.     Breath sounds: Wheezing (intermittent, end expiratory) present. No rales.  Abdominal:     General: There is no distension.     Palpations: Abdomen is soft.     Tenderness: There is no abdominal tenderness.  Musculoskeletal:        General: No deformity.     Right lower leg: No edema.     Left lower leg: No edema.     Comments: Back: No midline tenderness to palpation.  Tenderness to palpation over left side of lumbar spine.  Tightness of paraspinal musculature.  Antalgic gait.  Strength is intact and symmetric in bilateral lower extremities.  Negative straight leg raise bilaterally.  Sensation is grossly intact to light touch. Right foot: Tenderness to palpation over plantar fascia at attachment on calcaneus.  Pain with dorsiflexion  Lymphadenopathy:     Cervical: No cervical adenopathy.  Skin:    General: Skin is warm and dry.     Capillary Refill: Capillary refill takes less than 2 seconds.     Findings: No rash.  Neurological:     Mental Status: She is alert and oriented to person, place, and time.      Depression Screen PHQ 2/9 Scores 05/23/2019  PHQ - 2 Score 4  PHQ- 9 Score 16       Assessment & Plan:     Routine Health Maintenance and Physical Exam  Exercise Activities and Dietary recommendations Goals   None      There is no immunization history on file for this patient.  Health Maintenance  Topic Date Due  . HIV Screening  07/06/1992  . TETANUS/TDAP  07/06/1996  . INFLUENZA VACCINE  05/26/2019  . PAP SMEAR-Modifier  11/09/2020     Discussed health benefits of physical activity, and encouraged her to engage in regular exercise appropriate for her age and  condition.    --------------------------------------------------------------------  Problem List Items Addressed This Visit      Musculoskeletal and Integument   Plantar fasciitis    Previously diagnosed about 3 months ago Patient has not been doing any stretching or icing her home physical therapy She is not interested in formal PT referral at this time She has gotten more supportive footwear with arch support We discussed the natural course and the need to do home exercise program She will call back if this is not improving        Other   Tobacco use disorder    Again discussed the harms of continuing to smoke and the benefits of tobacco cessation Patient remains precontemplative We will continue to encourage cessation at future visits Of note, would not use Zyban to help with tobacco cessation as patient has a history of intentional overdose of Wellbutrin Would also be cautious with Chantix given her history of suicide attempt      History of suicide attempt    History of intentional overdose with Wellbutrin She does have passive SI currently, but denies any active plan She was contracted for safety and does not seem to be a danger to herself currently      Relevant Orders   Ambulatory referral to Psychology   MDD (major depressive disorder)    Patient states that her symptoms are not well controlled as they were previously She knows the importance of therapy in the treatment of depression and agrees to referral today As above, she is having some passive SI and was contracted for safety We will resume Cymbalta at 30 mg daily as this was previously helpful to her We discussed that it can take 6 to 8 weeks to reach full efficacy We discussed potential side effects including GI upset, sexual dysfunction, worsening SI Follow-up in 6 weeks and consider dose titration Repeat PHQ 9 at next visit      Relevant Medications   DULoxetine (CYMBALTA) 30 MG capsule   Other  Relevant Orders   Ambulatory referral to Psychology   Mixed hyperlipidemia    Patient has been working on lifestyle interventions for the last 3 months Recheck fasting lipid panel and CMP      Relevant Orders   Lipid panel   Comprehensive metabolic panel   Acute left-sided low back pain with left-sided sciatica    New problem No red flag symptoms She does have some left-sided sciatica, but is neuro intact on exam Discussed natural course of acute back pain Discussed rest, ice/heat, anti-inflammatories Can start home exercises when she is out of the acute phase No indication for imaging at this time Prednisone burst and taper prescribed Return precautions discussed      Relevant Medications   predniSONE (DELTASONE) 20 MG tablet    Other Visit Diagnoses    Encounter for annual physical exam    -  Primary   Relevant Orders   CBC w/Diff/Platelet   Lipid panel   Comprehensive metabolic panel   Leukocytosis, unspecified type       Relevant Orders   CBC w/Diff/Platelet       Return in about 6 weeks (around 10/03/2019) for MDD f/u.   The  entirety of the information documented in the History of Present Illness, Review of Systems and Physical Exam were personally obtained by me. Portions of this information were initially documented by Kavin Leech, CMA and reviewed by me for thoroughness and accuracy.    Shaundrea Carrigg, Marzella Schlein, MD MPH [facility]

## 2019-08-22 NOTE — Assessment & Plan Note (Signed)
Previously diagnosed about 3 months ago Patient has not been doing any stretching or icing her home physical therapy She is not interested in formal PT referral at this time She has gotten more supportive footwear with arch support We discussed the natural course and the need to do home exercise program She will call back if this is not improving

## 2019-08-22 NOTE — Assessment & Plan Note (Signed)
Patient has been working on lifestyle interventions for the last 3 months Recheck fasting lipid panel and CMP

## 2019-08-23 DIAGNOSIS — E782 Mixed hyperlipidemia: Secondary | ICD-10-CM | POA: Diagnosis not present

## 2019-08-23 DIAGNOSIS — D72829 Elevated white blood cell count, unspecified: Secondary | ICD-10-CM | POA: Diagnosis not present

## 2019-08-23 DIAGNOSIS — Z0000 Encounter for general adult medical examination without abnormal findings: Secondary | ICD-10-CM | POA: Diagnosis not present

## 2019-08-24 LAB — CBC WITH DIFFERENTIAL/PLATELET
Basophils Absolute: 0.1 10*3/uL (ref 0.0–0.2)
Basos: 0 %
EOS (ABSOLUTE): 0.1 10*3/uL (ref 0.0–0.4)
Eos: 0 %
Hematocrit: 45.4 % (ref 34.0–46.6)
Hemoglobin: 15.2 g/dL (ref 11.1–15.9)
Immature Grans (Abs): 0 10*3/uL (ref 0.0–0.1)
Immature Granulocytes: 0 %
Lymphocytes Absolute: 3.5 10*3/uL — ABNORMAL HIGH (ref 0.7–3.1)
Lymphs: 20 %
MCH: 32.8 pg (ref 26.6–33.0)
MCHC: 33.5 g/dL (ref 31.5–35.7)
MCV: 98 fL — ABNORMAL HIGH (ref 79–97)
Monocytes Absolute: 0.9 10*3/uL (ref 0.1–0.9)
Monocytes: 5 %
Neutrophils Absolute: 13 10*3/uL — ABNORMAL HIGH (ref 1.4–7.0)
Neutrophils: 75 %
Platelets: 314 10*3/uL (ref 150–450)
RBC: 4.64 x10E6/uL (ref 3.77–5.28)
RDW: 12.3 % (ref 11.7–15.4)
WBC: 17.6 10*3/uL — ABNORMAL HIGH (ref 3.4–10.8)

## 2019-08-24 LAB — LIPID PANEL
Chol/HDL Ratio: 3.8 ratio (ref 0.0–4.4)
Cholesterol, Total: 219 mg/dL — ABNORMAL HIGH (ref 100–199)
HDL: 57 mg/dL (ref >39)
LDL Chol Calc (NIH): 143 mg/dL — ABNORMAL HIGH (ref 0–99)
Triglycerides: 109 mg/dL (ref 0–149)
VLDL Cholesterol Cal: 19 mg/dL (ref 5–40)

## 2019-08-24 LAB — COMPREHENSIVE METABOLIC PANEL
ALT: 14 IU/L (ref 0–32)
AST: 14 IU/L (ref 0–40)
Albumin/Globulin Ratio: 1.9 (ref 1.2–2.2)
Albumin: 4.7 g/dL (ref 3.8–4.8)
Alkaline Phosphatase: 74 IU/L (ref 39–117)
BUN/Creatinine Ratio: 11 (ref 9–23)
BUN: 8 mg/dL (ref 6–24)
Bilirubin Total: 0.5 mg/dL (ref 0.0–1.2)
CO2: 18 mmol/L — ABNORMAL LOW (ref 20–29)
Calcium: 9.3 mg/dL (ref 8.7–10.2)
Chloride: 106 mmol/L (ref 96–106)
Creatinine, Ser: 0.72 mg/dL (ref 0.57–1.00)
GFR calc Af Amer: 119 mL/min/{1.73_m2} (ref >59)
GFR calc non Af Amer: 104 mL/min/{1.73_m2} (ref >59)
Globulin, Total: 2.5 g/dL (ref 1.5–4.5)
Glucose: 83 mg/dL (ref 65–99)
Potassium: 4 mmol/L (ref 3.5–5.2)
Sodium: 140 mmol/L (ref 134–144)
Total Protein: 7.2 g/dL (ref 6.0–8.5)

## 2019-08-27 ENCOUNTER — Telehealth: Payer: Self-pay

## 2019-08-27 NOTE — Telephone Encounter (Signed)
Pt advised.   Thanks,   -Redell Nazir  

## 2019-08-27 NOTE — Telephone Encounter (Signed)
LMTCB 08/27/2019  Thanks,   -Elam Ellis  

## 2019-08-27 NOTE — Telephone Encounter (Signed)
-----   Message from Virginia Crews, MD sent at 08/24/2019  4:38 PM EDT ----- Normal labs, except cholesterol is slightly high.  I recommend diet low in saturated fat and regular exercise - 30 min at least 5 times per week.  WBC is elevated ans has increased since 3 months ago.  Recommend following up with surgeon regarding the breast pain since surgery.

## 2019-08-27 NOTE — Telephone Encounter (Signed)
Received a message from after hours service stating that patient is returning your call and requesting a call back. °

## 2019-08-27 NOTE — Telephone Encounter (Signed)
LMTCB 08/27/2019  Thanks,   -Clarke Peretz  

## 2019-08-29 ENCOUNTER — Encounter: Payer: Self-pay | Admitting: Surgery

## 2019-08-29 ENCOUNTER — Other Ambulatory Visit: Payer: Self-pay

## 2019-08-29 ENCOUNTER — Ambulatory Visit (INDEPENDENT_AMBULATORY_CARE_PROVIDER_SITE_OTHER): Payer: Self-pay | Admitting: Surgery

## 2019-08-29 VITALS — BP 110/76 | HR 89 | Temp 97.7°F | Ht 66.0 in | Wt 178.0 lb

## 2019-08-29 DIAGNOSIS — N6452 Nipple discharge: Secondary | ICD-10-CM

## 2019-08-29 NOTE — Progress Notes (Signed)
08/29/2019  HPI: Melinda Olsen is a 43 y.o. female s/p left breast subareolar duct excision on 10/7.  She was last seen on 10/21 for post-op follow up and was doing great.  She saw her PCP recently and her labs showed an elevated WBC.  There was concern for possible yeast infection at the incision site and was recommended to be seen in our office.  Patient reports some sensitivity at the left nipple and incision but denies any pain, redness of the incision, edema, or drainage.  She does report seeing the knot of the suture poking through the skin.  Otherwise, she denies any fevers, chills, sneezing, congestion, coughing, chest pain, shortness of breath, nausea, vomiting, abdominal pain, diarrhea, constipation.  Vital signs: BP 110/76   Pulse 89   Temp 97.7 F (36.5 C)   Ht 5\' 6"  (1.676 m)   Wt 178 lb (80.7 kg)   LMP 11/29/2017 (Exact Date)   BMI 28.73 kg/m    Physical Exam: Constitutional: No acute distress Breast:  Left breast lateral periareolar incision healing well, without any evidence of infection.  The wound shows no edema, erythema, and there is no drainage.  There is a knot from the subcuticular suture at the inferior corner of the wound.  This was excised without complications.  Assessment/Plan: This is a 43 y.o. female s/p left breast subareolar duct excision.  --Discussed with the patient that on my exam, there is no evidence of any infection, drainage, wound issues.  No need for any antibiotics or yeast medication.  Suture removed without complications. --Unclear as to why her WBC is elevated but no evident issues that could bring about an elevated WBC at this point. --Follow up as needed.   [name], [facility]

## 2019-08-29 NOTE — Patient Instructions (Signed)
   Follow-up with our office as needed.  Please call and ask to speak with a nurse if you develop questions or concerns.  

## 2019-09-06 ENCOUNTER — Encounter: Payer: Self-pay | Admitting: Emergency Medicine

## 2019-09-06 ENCOUNTER — Emergency Department
Admission: EM | Admit: 2019-09-06 | Discharge: 2019-09-06 | Disposition: A | Discharge status: Home / Self Care | Payer: Medicaid Other | Attending: Emergency Medicine | Admitting: Emergency Medicine

## 2019-09-06 ENCOUNTER — Other Ambulatory Visit: Payer: Self-pay

## 2019-09-06 ENCOUNTER — Telehealth: Payer: Self-pay | Admitting: Family Medicine

## 2019-09-06 DIAGNOSIS — F17210 Nicotine dependence, cigarettes, uncomplicated: Secondary | ICD-10-CM | POA: Insufficient documentation

## 2019-09-06 DIAGNOSIS — R42 Dizziness and giddiness: Secondary | ICD-10-CM

## 2019-09-06 DIAGNOSIS — R197 Diarrhea, unspecified: Secondary | ICD-10-CM | POA: Diagnosis not present

## 2019-09-06 LAB — URINALYSIS, COMPLETE (UACMP) WITH MICROSCOPIC
Bacteria, UA: NONE SEEN
Bilirubin Urine: NEGATIVE
Glucose, UA: NEGATIVE mg/dL
Hgb urine dipstick: NEGATIVE
Ketones, ur: NEGATIVE mg/dL
Leukocytes,Ua: NEGATIVE
Nitrite: NEGATIVE
Protein, ur: NEGATIVE mg/dL
Specific Gravity, Urine: 1.002 — ABNORMAL LOW (ref 1.005–1.030)
pH: 6 (ref 5.0–8.0)

## 2019-09-06 LAB — COMPREHENSIVE METABOLIC PANEL
ALT: 17 U/L (ref 0–44)
AST: 16 U/L (ref 15–41)
Albumin: 4.3 g/dL (ref 3.5–5.0)
Alkaline Phosphatase: 61 U/L (ref 38–126)
Anion gap: 10 (ref 5–15)
BUN: 6 mg/dL (ref 6–20)
CO2: 23 mmol/L (ref 22–32)
Calcium: 9.2 mg/dL (ref 8.9–10.3)
Chloride: 105 mmol/L (ref 98–111)
Creatinine, Ser: 0.74 mg/dL (ref 0.44–1.00)
GFR calc Af Amer: >60 mL/min (ref >60)
GFR calc non Af Amer: >60 mL/min (ref >60)
Glucose, Bld: 93 mg/dL (ref 70–99)
Potassium: 4.1 mmol/L (ref 3.5–5.1)
Sodium: 138 mmol/L (ref 135–145)
Total Bilirubin: 0.7 mg/dL (ref 0.3–1.2)
Total Protein: 7.5 g/dL (ref 6.5–8.1)

## 2019-09-06 LAB — CBC
HCT: 44.6 % (ref 36.0–46.0)
Hemoglobin: 15.2 g/dL — ABNORMAL HIGH (ref 12.0–15.0)
MCH: 32.1 pg (ref 26.0–34.0)
MCHC: 34.1 g/dL (ref 30.0–36.0)
MCV: 94.3 fL (ref 80.0–100.0)
Platelets: 273 10*3/uL (ref 150–400)
RBC: 4.73 MIL/uL (ref 3.87–5.11)
RDW: 12.4 % (ref 11.5–15.5)
WBC: 12.6 10*3/uL — ABNORMAL HIGH (ref 4.0–10.5)
nRBC: 0 % (ref 0.0–0.2)

## 2019-09-06 LAB — LIPASE, BLOOD: Lipase: 29 U/L (ref 11–51)

## 2019-09-06 MED ORDER — METOCLOPRAMIDE HCL 5 MG/ML IJ SOLN: 10.0000 mg | Freq: Once | INTRAMUSCULAR | Status: DC

## 2019-09-06 MED ORDER — SODIUM CHLORIDE 0.9 % IV SOLN: Freq: Once | INTRAVENOUS | Status: DC

## 2019-09-06 MED ORDER — KETOROLAC TROMETHAMINE 30 MG/ML IJ SOLN: 30.0000 mg | Freq: Once | INTRAMUSCULAR | Status: DC

## 2019-09-06 MED ORDER — ONDANSETRON 4 MG PO TBDP: 4.0000 mg | ORAL_TABLET | Freq: Three times a day (TID) | ORAL | 0 refills | Status: DC | PRN

## 2019-09-06 MED ORDER — ACETAMINOPHEN 500 MG PO TABS
1000.0000 mg | ORAL_TABLET | Freq: Once | ORAL | Status: AC
  Administered 2019-09-06: 20:00:00 1000 mg via ORAL
  Filled 2019-09-06: qty 2

## 2019-09-06 MED ORDER — ONDANSETRON 4 MG PO TBDP
4.0000 mg | ORAL_TABLET | Freq: Once | ORAL | Status: AC
  Administered 2019-09-06: 20:00:00 4 mg via ORAL
  Filled 2019-09-06: qty 1

## 2019-09-06 NOTE — Telephone Encounter (Signed)
noted 

## 2019-09-06 NOTE — ED Provider Notes (Signed)
[facility] Emergency Department Provider Note       Time seen: ----------------------------------------- 7:58 PM on 09/06/2019 -----------------------------------------   I have reviewed the triage vital signs and the nursing notes.  HISTORY   Chief Complaint Dizziness    HPI Bitania Shankland is a 43 y.o. female with a history of anxiety, asthma, depression, endometriosis who presents to the ED for dizziness that started this morning.  Patient describes it as lightheadedness.  She denies pain but has had nausea many episodes of diarrhea today.  She denies fever but has had chills.  Past Medical History:  Diagnosis Date  . Anxiety    NO MEDS  . Asthma    WELL CONTROLLED  . Depression    NO MEDS  . Dyspnea    PT STATES CHRONIC SOB (SMOKER) BUT STATES SHE CAN WALK A MILE WITHOUT GETTING SOB OR HAVING CHEST PAIN  . Endometriosis   . Family history of adverse reaction to anesthesia    DAD-CAN HEAR SURGEONS TALKING DURING SURGERY BUT NEVER FEELS ANYTHING  . Seizures ([place]) 2016   X2 -NO MEDS    Patient Active Problem List   Diagnosis Date Noted  . Mixed hyperlipidemia 08/22/2019  . Acute left-sided low back pain with left-sided sciatica 08/22/2019  . Discharge from left nipple 06/15/2019  . Bilateral leg cramps 05/25/2019  . Plantar fasciitis 05/25/2019  . Radicular pain in right arm 05/25/2019  . Suprapubic pain 05/25/2019  . Urinary urgency 05/25/2019  . Tobacco use disorder 05/25/2019  . Mild intermittent asthma without complication 23/55/7322  . History of suicide attempt 05/25/2019  . MDD (major depressive disorder) 05/25/2019  . Adenomyosis 12/01/2017  . Dysmenorrhea 11/09/2017  . Galactorrhea 11/09/2017  . Chronic pelvic pain in female 11/09/2017    Past Surgical History:  Procedure Laterality Date  . BREAST DUCTAL SYSTEM EXCISION Left 08/01/2019   Procedure: EXCISION DUCTAL SYSTEM BREAST;  Surgeon: Olean Ree, MD;  Location:  [facility] ORS;  Service: General;  Laterality: Left;  . CESAREAN SECTION     X2  . CYSTOSCOPY  12/22/2017   Procedure: CYSTOSCOPY;  Surgeon: Gae Dry, MD;  Location: [facility] ORS;  Service: Gynecology;;  . LAPAROSCOPIC HYSTERECTOMY Bilateral 12/22/2017   Procedure: HYSTERECTOMY TOTAL LAPAROSCOPIC BILATERAL SALPINGECTOMY;  Surgeon: Gae Dry, MD;  Location: [facility] ORS;  Service: Gynecology;  Laterality: Bilateral;  . TUBAL LIGATION      Allergies Patient has no known allergies.  Social History Social History   Tobacco Use  . Smoking status: Current Every Day Smoker    Packs/day: 1.00    Years: 23.00    Pack years: 23.00    Types: Cigarettes  . Smokeless tobacco: Never Used  Substance Use Topics  . Alcohol use: Yes    Alcohol/week: 2.0 standard drinks    Types: 2 Cans of beer per week    Comment: once weekly  . Drug use: No   Review of Systems Constitutional: Negative for fever.  Positive for chills Cardiovascular: Negative for chest pain. Respiratory: Negative for shortness of breath. Gastrointestinal: Negative for abdominal pain, positive for nausea and diarrhea Musculoskeletal: Negative for back pain. Skin: Negative for rash. Neurological: Negative for headaches, focal weakness or numbness.  All systems negative/normal/unremarkable except as stated in the HPI  ____________________________________________   PHYSICAL EXAM:  VITAL SIGNS: ED Triage Vitals  Enc Vitals Group     BP 09/06/19 1706 132/84     Pulse Rate 09/06/19 1706 86  Resp 09/06/19 1706 18     Temp 09/06/19 1706 98.9 F (37.2 C)     Temp Source 09/06/19 1706 Oral     SpO2 09/06/19 1706 99 %     Weight 09/06/19 1707 178 lb (80.7 kg)     Height 09/06/19 1707 5\' 6"  (1.676 m)     Head Circumference --      Peak Flow --      Pain Score 09/06/19 1707 0     Pain Loc --      Pain Edu? --      Excl. in GC? --    Constitutional: Alert and oriented. Well appearing and in no distress. Eyes:  Conjunctivae are normal. Normal extraocular movements. Cardiovascular: Normal rate, regular rhythm. No murmurs, rubs, or gallops. Respiratory: Normal respiratory effort without tachypnea nor retractions. Breath sounds are clear and equal bilaterally. No wheezes/rales/rhonchi. Gastrointestinal: Soft and nontender. Normal bowel sounds Musculoskeletal: Nontender with normal range of motion in extremities. No lower extremity tenderness nor edema. Neurologic:  Normal speech and language. No gross focal neurologic deficits are appreciated.  Skin:  Skin is warm, dry and intact. No rash noted. Psychiatric: Mood and affect are normal. Speech and behavior are normal.  ____________________________________________  EKG: Interpreted by me.  Sinus rhythm with a rate of 71 bpm, normal PR interval, normal QRS, normal QT  ____________________________________________  ED COURSE:  As part of my medical decision making, I reviewed the following data within the electronic MEDICAL RECORD NUMBER History obtained from family if available, nursing notes, old chart and ekg, as well as notes from prior ED visits. Patient presented for dizziness, we will assess with labs and imaging as indicated at this time.   Procedures  Day Greb was evaluated in Emergency Department on 09/06/2019 for the symptoms described in the history of present illness. She was evaluated in the context of the global COVID-19 pandemic, which necessitated consideration that the patient might be at risk for infection with the SARS-CoV-2 virus that causes COVID-19. Institutional protocols and algorithms that pertain to the evaluation of patients at risk for COVID-19 are in a state of rapid change based on information released by regulatory bodies including the CDC and federal and state organizations. These policies and algorithms were followed during the patient's care in the ED.  ____________________________________________   LABS (pertinent  positives/negatives)  Labs Reviewed  CBC - Abnormal; Notable for the following components:      Result Value   WBC 12.6 (*)    Hemoglobin 15.2 (*)    All other components within normal limits  URINALYSIS, COMPLETE (UACMP) WITH MICROSCOPIC - Abnormal; Notable for the following components:   Color, Urine STRAW (*)    APPearance CLEAR (*)    Specific Gravity, Urine 1.002 (*)    All other components within normal limits  LIPASE, BLOOD  COMPREHENSIVE METABOLIC PANEL  ____________________________________________   DIFFERENTIAL DIAGNOSIS   Dehydration, electrolyte abnormality, gastroenteritis, near syncope, arrhythmia, orthostasis  FINAL ASSESSMENT AND PLAN  Dizziness   Plan: The patient had presented for dizziness likely secondary to . Patient's labs were unremarkable.  I did offer IV fluids as well as Reglan and Toradol but she states she needed to leave and was feeling better.  I did give oral Tylenol and Zofran.  She will be discharged home with similar.  She is advised to take Imodium for the diarrhea.  13/09/2019, MD    Note: This note was generated in part or whole with  voice recognition software. Voice recognition is usually quite accurate but there are transcription errors that can and very often do occur. I apologize for any typographical errors that were not detected and corrected.     Emily FilbertWilliams, Jonathan E, MD 09/06/19 2009

## 2019-09-06 NOTE — Telephone Encounter (Signed)
Pt not feeling right. Since taking DULoxetine (CYMBALTA) 30 MG capsule. She feels confused, flightily and nauseated.  Please call pt back at [phone].  Thanks, [employer]

## 2019-09-06 NOTE — Telephone Encounter (Signed)
Patient started taking Duloxetine on10/28/2020. Patient reports symptoms started 3 days ago. Also having diarrhea that started today.  Patient reports having cold chills denies any fever. Patient reports she is on her way to the ER.

## 2019-09-06 NOTE — ED Triage Notes (Signed)
Here for dizziness starting this AM.  Describes as lightheaded when asked.  Feels foggy and confused.  denies pain. + nausea and many episodes of diarrhea today. No fever.  Unlabored. VSS.  + chills.  No urinary sx.

## 2019-09-06 NOTE — Telephone Encounter (Signed)
How long ago did she start taking it?  She could move dosing to bedtime and see if that helps.  We could decrease the dose to 20mg  instead as another option.  These symptoms often get better as you stay on the medication.

## 2019-09-21 ENCOUNTER — Other Ambulatory Visit: Payer: Self-pay

## 2019-09-21 DIAGNOSIS — N644 Mastodynia: Secondary | ICD-10-CM | POA: Insufficient documentation

## 2019-09-21 DIAGNOSIS — Z5321 Procedure and treatment not carried out due to patient leaving prior to being seen by health care provider: Secondary | ICD-10-CM | POA: Insufficient documentation

## 2019-09-21 LAB — COMPREHENSIVE METABOLIC PANEL
ALT: 18 U/L (ref 0–44)
AST: 17 U/L (ref 15–41)
Albumin: 4.2 g/dL (ref 3.5–5.0)
Alkaline Phosphatase: 52 U/L (ref 38–126)
Anion gap: 10 (ref 5–15)
BUN: 14 mg/dL (ref 6–20)
CO2: 22 mmol/L (ref 22–32)
Calcium: 8.5 mg/dL — ABNORMAL LOW (ref 8.9–10.3)
Chloride: 106 mmol/L (ref 98–111)
Creatinine, Ser: 0.71 mg/dL (ref 0.44–1.00)
GFR calc Af Amer: >60 mL/min (ref >60)
GFR calc non Af Amer: >60 mL/min (ref >60)
Glucose, Bld: 76 mg/dL (ref 70–99)
Potassium: 3.5 mmol/L (ref 3.5–5.1)
Sodium: 138 mmol/L (ref 135–145)
Total Bilirubin: 0.6 mg/dL (ref 0.3–1.2)
Total Protein: 7.3 g/dL (ref 6.5–8.1)

## 2019-09-21 LAB — CBC WITH DIFFERENTIAL/PLATELET
Abs Immature Granulocytes: 0.04 10*3/uL (ref 0.00–0.07)
Basophils Absolute: 0.1 10*3/uL (ref 0.0–0.1)
Basophils Relative: 1 %
Eosinophils Absolute: 0.3 10*3/uL (ref 0.0–0.5)
Eosinophils Relative: 2 %
HCT: 40.5 % (ref 36.0–46.0)
Hemoglobin: 13.6 g/dL (ref 12.0–15.0)
Immature Granulocytes: 0 %
Lymphocytes Relative: 35 %
Lymphs Abs: 4.2 10*3/uL — ABNORMAL HIGH (ref 0.7–4.0)
MCH: 32.2 pg (ref 26.0–34.0)
MCHC: 33.6 g/dL (ref 30.0–36.0)
MCV: 96 fL (ref 80.0–100.0)
Monocytes Absolute: 0.5 10*3/uL (ref 0.1–1.0)
Monocytes Relative: 5 %
Neutro Abs: 6.8 10*3/uL (ref 1.7–7.7)
Neutrophils Relative %: 57 %
Platelets: 297 10*3/uL (ref 150–400)
RBC: 4.22 MIL/uL (ref 3.87–5.11)
RDW: 12.4 % (ref 11.5–15.5)
WBC: 11.9 10*3/uL — ABNORMAL HIGH (ref 4.0–10.5)
nRBC: 0 % (ref 0.0–0.2)

## 2019-09-21 NOTE — ED Triage Notes (Signed)
Patient c/o left breast pain and leakage beginning yesterday. Patient reports hx of same with milk duct removal surgery performed at this hospital 1 month ago in an attempt to alleviate the symptoms. Patient denies hx of breast cancer.

## 2019-09-22 ENCOUNTER — Emergency Department
Admission: EM | Admit: 2019-09-22 | Discharge: 2019-09-22 | Disposition: A | Discharge status: Home / Self Care | Payer: Medicaid Other | Attending: Emergency Medicine | Admitting: Emergency Medicine

## 2019-09-22 NOTE — ED Notes (Signed)
No answer when called 

## 2019-09-22 NOTE — ED Notes (Signed)
No answer when called to bring to room.

## 2019-09-22 NOTE — ED Notes (Signed)
No answer when called x1 

## 2019-09-24 ENCOUNTER — Telehealth: Payer: Self-pay | Admitting: Surgery

## 2019-09-24 ENCOUNTER — Telehealth: Payer: Self-pay | Admitting: Emergency Medicine

## 2019-09-24 NOTE — Telephone Encounter (Signed)
Patient states she is experiencing breast pain that feels as if needles are poking in her L breast and has some white discharge. Per Otho Ket, PA-C, recommends patient to come in for an appointment. Patient is scheduled with Dr.Piscoya 09/25/19 at 9:45a.

## 2019-09-24 NOTE — Telephone Encounter (Signed)
Patient is calling said she is still having a lot of drainage and is asking if one of the nurses could give her a call.

## 2019-09-24 NOTE — Telephone Encounter (Signed)
Called patient due to lwot to inquire about condition and follow up plans. Left message.   

## 2019-09-25 ENCOUNTER — Ambulatory Visit (INDEPENDENT_AMBULATORY_CARE_PROVIDER_SITE_OTHER): Payer: Medicaid Other | Admitting: Surgery

## 2019-09-25 ENCOUNTER — Other Ambulatory Visit: Payer: Self-pay

## 2019-09-25 ENCOUNTER — Encounter: Payer: Self-pay | Admitting: Surgery

## 2019-09-25 VITALS — BP 124/84 | HR 91 | Temp 97.3°F | Ht 66.0 in | Wt 179.6 lb

## 2019-09-25 DIAGNOSIS — N6002 Solitary cyst of left breast: Secondary | ICD-10-CM

## 2019-09-25 DIAGNOSIS — N6452 Nipple discharge: Secondary | ICD-10-CM

## 2019-09-25 NOTE — Patient Instructions (Addendum)
We will refer you to a [facility] at [facility] or [facility]. They will call you to schedule this appointment.   Follow-up with our office as needed.  Please call and ask to speak with a nurse if you develop questions or concerns.

## 2019-09-25 NOTE — Progress Notes (Signed)
09/25/2019  HPI: Melinda Olsen is a 43 y.o. female s/p left breast central duct excision on 10/7 for left nipple discharge.  She presents today because she's been having issues with pain in the left breast as well as white discharge from the nipple area.  She describes the pain as needles like stabbing sensation and after further questions, describes it as more superficial.  The discharge is white and she no longer has the pressure sensation that she had before.  She's unsure where exactly the discharge comes from within the nipple/areola.  Denies any fevers, chills, or issues with the incision itself.  She also describes feeling two small masses on the right breast where she had preop MRI guided biopsies x 2.  Vital signs: BP 124/84   Pulse 91   Temp (!) 97.3 F (36.3 C) (Temporal)   Ht 5\' 6"  (1.676 m)   Wt 179 lb 9.6 oz (81.5 kg)   LMP 11/29/2017 (Exact Date)   SpO2 98%   BMI 28.99 kg/m    Physical Exam: Constitutional:   No acute distress Breast:  Left breast s/p central duct excision with left periareolar incision which is well healed.  There is no discharge coming from the nipple itself.  However, there is small papule on the right edge of the nipple which had been seen previously which has some keratin like discharge on manual pressure.  There are other small areas in the areola particularly on the inferior portion which also have the same type of discharge with manual pressure.  However, there is no drainage from the main ducts in the nipple like before surgery.  Assessment/Plan: This is a 43 y.o. female s/p left breast central duct excision   Discussed with the patient that these areas of discharge are more superficial within the areola.  These would possibly be Montgomery glands or cysts if they are draining.  It does not appear that the drainage is related to her prior central duct excision, as the drainage is more peripheral.  At this point though, discussed with her that I would  refer her to a breast surgeon that would be able to care better for these cysts.  She's in agreement.   She prefers [place] and we'll start with referral to [facility].  If no breast surgeons in the group, then we can refer to [facility] or [facility].   [name], [facility]

## 2019-09-26 ENCOUNTER — Other Ambulatory Visit: Payer: Self-pay

## 2019-09-26 DIAGNOSIS — N6002 Solitary cyst of left breast: Secondary | ICD-10-CM

## 2019-09-26 NOTE — Progress Notes (Signed)
Referral sent to [facility], Dr Rolm Bookbinder to see the patient for further work up for nipple cysts.  [place]: [phone] Fax: [phone]

## 2019-10-08 ENCOUNTER — Ambulatory Visit: Payer: Medicaid Other | Admitting: Family Medicine

## 2019-12-06 ENCOUNTER — Emergency Department (HOSPITAL_COMMUNITY)
Admission: EM | Admit: 2019-12-06 | Discharge: 2019-12-07 | Disposition: A | Discharge status: Home / Self Care | Payer: Medicaid Other | Attending: Emergency Medicine | Admitting: Emergency Medicine

## 2019-12-06 ENCOUNTER — Encounter (HOSPITAL_COMMUNITY): Payer: Self-pay | Admitting: Emergency Medicine

## 2019-12-06 ENCOUNTER — Other Ambulatory Visit: Payer: Self-pay

## 2019-12-06 DIAGNOSIS — F19929 Other psychoactive substance use, unspecified with intoxication, unspecified: Secondary | ICD-10-CM | POA: Insufficient documentation

## 2019-12-06 DIAGNOSIS — F17210 Nicotine dependence, cigarettes, uncomplicated: Secondary | ICD-10-CM | POA: Insufficient documentation

## 2019-12-06 DIAGNOSIS — R4182 Altered mental status, unspecified: Secondary | ICD-10-CM | POA: Diagnosis not present

## 2019-12-06 DIAGNOSIS — Y906 Blood alcohol level of 120-199 mg/100 ml: Secondary | ICD-10-CM | POA: Diagnosis not present

## 2019-12-06 DIAGNOSIS — T7421XA Adult sexual abuse, confirmed, initial encounter: Secondary | ICD-10-CM | POA: Diagnosis not present

## 2019-12-06 DIAGNOSIS — Z79899 Other long term (current) drug therapy: Secondary | ICD-10-CM | POA: Diagnosis not present

## 2019-12-06 DIAGNOSIS — F1910 Other psychoactive substance abuse, uncomplicated: Secondary | ICD-10-CM

## 2019-12-06 DIAGNOSIS — R404 Transient alteration of awareness: Secondary | ICD-10-CM | POA: Diagnosis not present

## 2019-12-06 DIAGNOSIS — R0902 Hypoxemia: Secondary | ICD-10-CM | POA: Diagnosis not present

## 2019-12-06 DIAGNOSIS — F29 Unspecified psychosis not due to a substance or known physiological condition: Secondary | ICD-10-CM | POA: Diagnosis not present

## 2019-12-06 DIAGNOSIS — F10921 Alcohol use, unspecified with intoxication delirium: Secondary | ICD-10-CM | POA: Insufficient documentation

## 2019-12-06 LAB — CBC WITH DIFFERENTIAL/PLATELET
Abs Immature Granulocytes: 0.07 10*3/uL (ref 0.00–0.07)
Basophils Absolute: 0.1 10*3/uL (ref 0.0–0.1)
Basophils Relative: 1 %
Eosinophils Absolute: 0.1 10*3/uL (ref 0.0–0.5)
Eosinophils Relative: 0 %
HCT: 47.7 % — ABNORMAL HIGH (ref 36.0–46.0)
Hemoglobin: 16.1 g/dL — ABNORMAL HIGH (ref 12.0–15.0)
Immature Granulocytes: 0 %
Lymphocytes Relative: 10 %
Lymphs Abs: 1.5 10*3/uL (ref 0.7–4.0)
MCH: 33 pg (ref 26.0–34.0)
MCHC: 33.8 g/dL (ref 30.0–36.0)
MCV: 97.7 fL (ref 80.0–100.0)
Monocytes Absolute: 0.5 10*3/uL (ref 0.1–1.0)
Monocytes Relative: 3 %
Neutro Abs: 13.8 10*3/uL — ABNORMAL HIGH (ref 1.7–7.7)
Neutrophils Relative %: 86 %
Platelets: 302 10*3/uL (ref 150–400)
RBC: 4.88 MIL/uL (ref 3.87–5.11)
RDW: 12.9 % (ref 11.5–15.5)
WBC: 16 10*3/uL — ABNORMAL HIGH (ref 4.0–10.5)
nRBC: 0 % (ref 0.0–0.2)
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
## 2019-12-06 NOTE — ED Notes (Signed)
Consult to sane @23 :43pm.
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
## 2019-12-07 LAB — COMPREHENSIVE METABOLIC PANEL
ALT: 23 U/L (ref 0–44)
AST: 21 U/L (ref 15–41)
Albumin: 4.5 g/dL (ref 3.5–5.0)
Alkaline Phosphatase: 71 U/L (ref 38–126)
Anion gap: 12 (ref 5–15)
BUN: 7 mg/dL (ref 6–20)
CO2: 19 mmol/L — ABNORMAL LOW (ref 22–32)
Calcium: 8.7 mg/dL — ABNORMAL LOW (ref 8.9–10.3)
Chloride: 113 mmol/L — ABNORMAL HIGH (ref 98–111)
Creatinine, Ser: 0.72 mg/dL (ref 0.44–1.00)
GFR calc Af Amer: >60 mL/min (ref >60)
GFR calc non Af Amer: >60 mL/min (ref >60)
Glucose, Bld: 104 mg/dL — ABNORMAL HIGH (ref 70–99)
Potassium: 3.7 mmol/L (ref 3.5–5.1)
Sodium: 144 mmol/L (ref 135–145)
Total Bilirubin: 0.5 mg/dL (ref 0.3–1.2)
Total Protein: 8.2 g/dL — ABNORMAL HIGH (ref 6.5–8.1)

## 2019-12-07 LAB — RAPID URINE DRUG SCREEN, HOSP PERFORMED
Amphetamines: POSITIVE — AB
Barbiturates: NOT DETECTED
Benzodiazepines: POSITIVE — AB
Cocaine: NOT DETECTED
Opiates: NOT DETECTED
Tetrahydrocannabinol: NOT DETECTED

## 2019-12-07 LAB — ETHANOL: Alcohol, Ethyl (B): 143 mg/dL — ABNORMAL HIGH (ref <10)

## 2019-12-07 LAB — SALICYLATE LEVEL: Salicylate Lvl: <7 mg/dL — ABNORMAL LOW (ref 7.0–30.0)

## 2019-12-07 LAB — ACETAMINOPHEN LEVEL: Acetaminophen (Tylenol), Serum: <10 ug/mL — ABNORMAL LOW (ref 10–30)

## 2019-12-07 MED ORDER — NICOTINE 21 MG/24HR TD PT24
21.0000 mg | MEDICATED_PATCH | Freq: Every day | TRANSDERMAL | Status: DC
  Administered 2019-12-07: 02:00:00 21 mg via TRANSDERMAL
  Filled 2019-12-07: qty 1

## 2019-12-07 MED ORDER — LORAZEPAM 1 MG PO TABS: 0.0000 mg | ORAL_TABLET | Freq: Four times a day (QID) | ORAL | Status: DC

## 2019-12-07 MED ORDER — LORAZEPAM 2 MG/ML IJ SOLN: 0.0000 mg | Freq: Four times a day (QID) | INTRAMUSCULAR | Status: DC

## 2019-12-07 MED ORDER — ALUM & MAG HYDROXIDE-SIMETH 200-200-20 MG/5ML PO SUSP: 30.0000 mL | Freq: Four times a day (QID) | ORAL | Status: DC | PRN

## 2019-12-07 MED ORDER — ONDANSETRON HCL 4 MG PO TABS: 4.0000 mg | ORAL_TABLET | Freq: Three times a day (TID) | ORAL | Status: DC | PRN

## 2019-12-07 MED ORDER — THIAMINE HCL 100 MG/ML IJ SOLN: 100.0000 mg | Freq: Every day | INTRAMUSCULAR | Status: DC

## 2019-12-07 MED ORDER — ALBUTEROL SULFATE HFA 108 (90 BASE) MCG/ACT IN AERS: 2.0000 | INHALATION_SPRAY | RESPIRATORY_TRACT | Status: DC | PRN

## 2019-12-07 MED ORDER — THIAMINE HCL 100 MG PO TABS: 100.0000 mg | ORAL_TABLET | Freq: Every day | ORAL | Status: DC

## 2019-12-07 NOTE — ED Notes (Signed)
Cancel for SANE nurse per RN.

## 2019-12-07 NOTE — BHH Counselor (Signed)
TTS spoke with nurse, she states pt was given meds (5mg  versed) as well as ativan and will be given geodon soon. Not able to complete assessment at this time. TTS will complete assessment once patient is able to.

## 2019-12-09 ENCOUNTER — Emergency Department
Admission: EM | Admit: 2019-12-09 | Discharge: 2019-12-09 | Disposition: A | Discharge status: Home / Self Care | Payer: Medicaid Other | Attending: Emergency Medicine | Admitting: Emergency Medicine

## 2019-12-09 ENCOUNTER — Other Ambulatory Visit: Payer: Self-pay

## 2019-12-09 ENCOUNTER — Encounter: Payer: Self-pay | Admitting: Emergency Medicine

## 2019-12-09 DIAGNOSIS — F331 Major depressive disorder, recurrent, moderate: Secondary | ICD-10-CM | POA: Diagnosis not present

## 2019-12-09 DIAGNOSIS — F17210 Nicotine dependence, cigarettes, uncomplicated: Secondary | ICD-10-CM | POA: Diagnosis not present

## 2019-12-09 DIAGNOSIS — F339 Major depressive disorder, recurrent, unspecified: Secondary | ICD-10-CM | POA: Insufficient documentation

## 2019-12-09 DIAGNOSIS — R0981 Nasal congestion: Secondary | ICD-10-CM | POA: Diagnosis present

## 2019-12-09 DIAGNOSIS — L03114 Cellulitis of left upper limb: Secondary | ICD-10-CM | POA: Diagnosis not present

## 2019-12-09 LAB — CBC
HCT: 45.1 % (ref 36.0–46.0)
Hemoglobin: 15.3 g/dL — ABNORMAL HIGH (ref 12.0–15.0)
MCH: 32.8 pg (ref 26.0–34.0)
MCHC: 33.9 g/dL (ref 30.0–36.0)
MCV: 96.6 fL (ref 80.0–100.0)
Platelets: 309 10*3/uL (ref 150–400)
RBC: 4.67 MIL/uL (ref 3.87–5.11)
RDW: 12.8 % (ref 11.5–15.5)
WBC: 11.7 10*3/uL — ABNORMAL HIGH (ref 4.0–10.5)
nRBC: 0 % (ref 0.0–0.2)

## 2019-12-09 LAB — SALICYLATE LEVEL: Salicylate Lvl: <7 mg/dL — ABNORMAL LOW (ref 7.0–30.0)

## 2019-12-09 LAB — COMPREHENSIVE METABOLIC PANEL
ALT: 22 U/L (ref 0–44)
AST: 22 U/L (ref 15–41)
Albumin: 4.1 g/dL (ref 3.5–5.0)
Alkaline Phosphatase: 64 U/L (ref 38–126)
Anion gap: 10 (ref 5–15)
BUN: 7 mg/dL (ref 6–20)
CO2: 21 mmol/L — ABNORMAL LOW (ref 22–32)
Calcium: 9 mg/dL (ref 8.9–10.3)
Chloride: 106 mmol/L (ref 98–111)
Creatinine, Ser: 0.75 mg/dL (ref 0.44–1.00)
GFR calc Af Amer: >60 mL/min (ref >60)
GFR calc non Af Amer: >60 mL/min (ref >60)
Glucose, Bld: 129 mg/dL — ABNORMAL HIGH (ref 70–99)
Potassium: 3.7 mmol/L (ref 3.5–5.1)
Sodium: 137 mmol/L (ref 135–145)
Total Bilirubin: 0.5 mg/dL (ref 0.3–1.2)
Total Protein: 7.6 g/dL (ref 6.5–8.1)

## 2019-12-09 LAB — ETHANOL: Alcohol, Ethyl (B): 10 mg/dL — ABNORMAL HIGH (ref <10)

## 2019-12-09 LAB — ACETAMINOPHEN LEVEL: Acetaminophen (Tylenol), Serum: <10 ug/mL — ABNORMAL LOW (ref 10–30)

## 2019-12-09 MED ORDER — VENLAFAXINE HCL ER 37.5 MG PO CP24: 37.5000 mg | ORAL_CAPSULE | Freq: Every day | ORAL | Status: DC

## 2019-12-09 MED ORDER — SULFAMETHOXAZOLE-TRIMETHOPRIM 800-160 MG PO TABS: 1.0000 | ORAL_TABLET | Freq: Two times a day (BID) | ORAL | 0 refills | Status: DC

## 2019-12-09 MED ORDER — ONDANSETRON 8 MG PO TBDP: 8.0000 mg | ORAL_TABLET | Freq: Three times a day (TID) | ORAL | 0 refills | Status: DC | PRN

## 2019-12-09 MED ORDER — VENLAFAXINE HCL ER 37.5 MG PO CP24: 37.5000 mg | ORAL_CAPSULE | Freq: Every day | ORAL | 1 refills | Status: DC

## 2019-12-09 MED ORDER — OXYMETAZOLINE HCL 0.05 % NA SOLN
1.0000 | Freq: Once | NASAL | Status: AC
  Administered 2019-12-09: 17:00:00 1 via NASAL
  Filled 2019-12-09: qty 30
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
## 2019-12-09 NOTE — Discharge Instructions (Signed)
Use the Afrin nasal spray every 12 hours for up to 3 days as needed for the nasal congestion and bleeding.  Take the Bactrim as prescribed and finish the full 5-day course.  You may use the Zofran as needed for nausea.  Follow-up at [facility] or with a psychiatrist of your choice.  Return to the ER for new, worsening or persistent nosebleed, difficulty breathing, severe headache, vomiting, weakness, worsening symptoms of depression, any thoughts of wanting to hurt yourself or others, or any other new or worsening symptoms that concern you.

## 2019-12-09 NOTE — ED Provider Notes (Signed)
[facility] Emergency Department Provider Note ____________________________________________   First MD Initiated Contact with Patient 12/09/19 1522     (approximate)  I have reviewed the triage vital signs and the nursing notes.   HISTORY  Chief Complaint Epistaxis and Psychiatric Evaluation    HPI Melinda Olsen is a 44 y.o. female with PMH as noted below who presents with several complaints.  1.  Nasal congestion/discomfort.  The patient reports persistent nasal congestion and discomfort in the left side of her nose over the last few days after she received a Covid swab during another ED visit while she was intoxicated on methamphetamine and was not able to sit still.  She feels that there has been some blood going down the back of her throat that she has swallowed and can taste.  She has had no blood coming out of her nostril.  2.  Rash to left arm.  The patient reports redness and pain at the IV site in her left antecubital fossa where she had an IV placed few days ago during the same ED visit.  She has no fever chills or associated symptoms.  3.  Depression.  The patient reports worsening depression and anxiety after stopping taking Cymbalta 2 months ago.  She denies SI or HI but would like to speak to a psychiatrist.  Past Medical History:  Diagnosis Date  . Anxiety    NO MEDS  . Asthma    WELL CONTROLLED  . Depression    NO MEDS  . Dyspnea    PT STATES CHRONIC SOB (SMOKER) BUT STATES SHE CAN WALK A MILE WITHOUT GETTING SOB OR HAVING CHEST PAIN  . Endometriosis   . Family history of adverse reaction to anesthesia    DAD-CAN HEAR SURGEONS TALKING DURING SURGERY BUT NEVER FEELS ANYTHING  . Seizures (HCC) 2016   X2 -NO MEDS    Patient Active Problem List   Diagnosis Date Noted  . Major depressive disorder, recurrent episode, moderate (HCC) 12/09/2019  . Mixed hyperlipidemia 08/22/2019  . Acute left-sided low back pain with left-sided  sciatica 08/22/2019  . Discharge from left nipple 06/15/2019  . Bilateral leg cramps 05/25/2019  . Plantar fasciitis 05/25/2019  . Radicular pain in right arm 05/25/2019  . Suprapubic pain 05/25/2019  . Urinary urgency 05/25/2019  . Tobacco use disorder 05/25/2019  . Mild intermittent asthma without complication 05/25/2019  . History of suicide attempt 05/25/2019  . MDD (major depressive disorder) 05/25/2019  . Adenomyosis 12/01/2017  . Dysmenorrhea 11/09/2017  . Galactorrhea 11/09/2017  . Chronic pelvic pain in female 11/09/2017    Past Surgical History:  Procedure Laterality Date  . BREAST DUCTAL SYSTEM EXCISION Left 08/01/2019   Procedure: EXCISION DUCTAL SYSTEM BREAST;  Surgeon: Henrene Dodge, MD;  Location: [facility] ORS;  Service: General;  Laterality: Left;  . CESAREAN SECTION     X2  . CYSTOSCOPY  12/22/2017   Procedure: CYSTOSCOPY;  Surgeon: Nadara Mustard, MD;  Location: [facility] ORS;  Service: Gynecology;;  . LAPAROSCOPIC HYSTERECTOMY Bilateral 12/22/2017   Procedure: HYSTERECTOMY TOTAL LAPAROSCOPIC BILATERAL SALPINGECTOMY;  Surgeon: Nadara Mustard, MD;  Location: [facility] ORS;  Service: Gynecology;  Laterality: Bilateral;  . TUBAL LIGATION      Prior to Admission medications   Medication Sig Start Date End Date Taking? Authorizing Provider  albuterol (PROVENTIL HFA;VENTOLIN HFA) 108 (90 Base) MCG/ACT inhaler Inhale 2 puffs into the lungs every 4 (four) hours as needed for wheezing or shortness of breath. 11/23/18  Cuthriell, Delorise Royals, PA-C  ondansetron (ZOFRAN ODT) 8 MG disintegrating tablet Take 1 tablet (8 mg total) by mouth every 8 (eight) hours as needed for nausea or vomiting. 12/09/19   Dionne Bucy, MD  sulfamethoxazole-trimethoprim (BACTRIM DS) 800-160 MG tablet Take 1 tablet by mouth 2 (two) times daily for 5 days. 12/09/19 12/14/19  Dionne Bucy, MD  venlafaxine XR (EFFEXOR-XR) 37.5 MG 24 hr capsule Take 1 capsule (37.5 mg total) by mouth daily with  breakfast. 12/10/19   Charm Rings, NP    Allergies Patient has no known allergies.  Family History  Problem Relation Age of Onset  . Throat cancer Mother   . Diabetes Mother   . Breast cancer Paternal Aunt   . Cervical cancer Paternal Aunt   . Diabetes Paternal Aunt   . Lung cancer Maternal Grandfather   . Heart attack Paternal Grandmother   . Diabetes Paternal Grandmother   . Lung cancer Paternal Aunt   . Diabetes Paternal Aunt     Social History Social History   Tobacco Use  . Smoking status: Current Every Day Smoker    Packs/day: 1.00    Years: 23.00    Pack years: 23.00    Types: Cigarettes  . Smokeless tobacco: Never Used  Substance Use Topics  . Alcohol use: Yes    Alcohol/week: 2.0 standard drinks    Types: 2 Cans of beer per week    Comment: once weekly  . Drug use: No    Review of Systems  Constitutional: No fever/chills. Eyes: No visual changes. ENT: Positive for nasal congestion. Cardiovascular: Denies chest pain. Respiratory: Denies shortness of breath. Gastrointestinal: No vomiting or diarrhea.  Genitourinary: Negative for dysuria.  Musculoskeletal: Negative for back pain. Skin: Negative for rash to left antecubital fossa. Neurological: Negative for headache.   ____________________________________________   PHYSICAL EXAM:  VITAL SIGNS: ED Triage Vitals  Enc Vitals Group     BP 12/09/19 1431 109/80     Pulse Rate 12/09/19 1431 (!) 120     Resp 12/09/19 1431 16     Temp 12/09/19 1431 98.4 F (36.9 C)     Temp Source 12/09/19 1431 Oral     SpO2 12/09/19 1431 98 %     Weight 12/09/19 1425 171 lb (77.6 kg)     Height 12/09/19 1425 5\' 6"  (1.676 m)     Head Circumference --      Peak Flow --      Pain Score 12/09/19 1425 8     Pain Loc --      Pain Edu? --      Excl. in GC? --     Constitutional: Alert and oriented. Well appearing and in no acute distress. Eyes: Conjunctivae are normal.  Head: Atraumatic. Nose: No  congestion/rhinnorhea.  Irritated mucosa to the left nasal passage posteriorly, with no visible bleeding.   Mouth/Throat: Mucous membranes are moist.  Oropharynx is clear. Neck: Normal range of motion.  Cardiovascular: Normal rate, regular rhythm.   Good peripheral circulation. Respiratory: Normal respiratory effort.  No retractions.  Gastrointestinal: No distention.  Musculoskeletal: Extremities warm and well perfused.  Neurologic:  Normal speech and language. No gross focal neurologic deficits are appreciated.  Skin:  Skin is warm and dry.  Left antecubital fossa with approximately 2 cm area of erythema and induration with no fluctuance and no purulent drainage.  Full range of motion at the elbow. Psychiatric: Mood and affect are normal. Speech and behavior are normal.  ____________________________________________  LABS (all labs ordered are listed, but only abnormal results are displayed)  Labs Reviewed  COMPREHENSIVE METABOLIC PANEL - Abnormal; Notable for the following components:      Result Value   CO2 21 (*)    Glucose, Bld 129 (*)    All other components within normal limits  ETHANOL - Abnormal; Notable for the following components:   Alcohol, Ethyl (B) 10 (*)    All other components within normal limits  SALICYLATE LEVEL - Abnormal; Notable for the following components:   Salicylate Lvl <9.7 (*)    All other components within normal limits  ACETAMINOPHEN LEVEL - Abnormal; Notable for the following components:   Acetaminophen (Tylenol), Serum <10 (*)    All other components within normal limits  CBC - Abnormal; Notable for the following components:   WBC 11.7 (*)    Hemoglobin 15.3 (*)    All other components within normal limits  URINE DRUG SCREEN, QUALITATIVE ([facility] ONLY)   ____________________________________________  EKG   ____________________________________________  RADIOLOGY    ____________________________________________   PROCEDURES  Procedure(s)  performed: No  Procedures  Critical Care performed: No ____________________________________________   INITIAL IMPRESSION / ASSESSMENT AND PLAN / ED COURSE  Pertinent labs & imaging results that were available during my care of the patient were reviewed by me and considered in my medical decision making (see chart for details).  44 year old female with PMH as noted above presents with left nasal congestion and discomfort, a small rash to her left antecubital fossa, and worsening depression symptoms with no SI or HI.  I reviewed the past medical records in epic.  The patient was seen at the ED at [facility] 3 days ago with methamphetamine intoxication.  That is when the patient states that she had the IV placed in her left antecubital fossa, and had the Covid swab which she feels irritated her nose.  On exam the patient is well-appearing.  She was slightly tachycardic at triage.  Her other vital signs are normal.  The remainder of the physical exam is as described above.   1.  Nasal congestion/discomfort.  The patient has no visible epistaxis and has not had any bleeding out of her nose.  The nasal passage shows mild irritation and the oropharynx is clear.  I will prescribe Afrin.  2.  Left antecubital area redness: This is a very small area and could be early/mild cellulitis.  I instructed the patient to apply topical bacitracin or Neosporin and will give a short course of Bactrim for prophylaxis.  3.  Depression: The patient denies SI or HI.  She is currently alert and oriented and appears well.  She is behaving appropriately and does not demonstrate acute psychiatric symptoms.  She has requested to see psychiatry.  She is not a danger to herself or others at this time.  ----------------------------------------- 4:37 PM on 12/09/2019 -----------------------------------------  NP Lord from the psychiatry service has prescribed patient with an alternate antidepressant.  The patient has  received Afrin, and I also prescribed Bactrim and Zofran.  At this time, the patient is stable for discharge home.  Return precautions given, and she expresses understanding.   ____________________________________________   FINAL CLINICAL IMPRESSION(S) / ED DIAGNOSES  Final diagnoses:  Nasal congestion  Left arm cellulitis      NEW MEDICATIONS STARTED DURING THIS VISIT:  New Prescriptions   ONDANSETRON (ZOFRAN ODT) 8 MG DISINTEGRATING TABLET    Take 1 tablet (8 mg total) by mouth every 8 (eight) hours  as needed for nausea or vomiting.   SULFAMETHOXAZOLE-TRIMETHOPRIM (BACTRIM DS) 800-160 MG TABLET    Take 1 tablet by mouth 2 (two) times daily for 5 days.   VENLAFAXINE XR (EFFEXOR-XR) 37.5 MG 24 HR CAPSULE    Take 1 capsule (37.5 mg total) by mouth daily with breakfast.     Note:  This document was prepared using Dragon voice recognition software and may include unintentional dictation errors.   Dionne Bucy, MD 12/09/19 [id number]
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
## 2019-12-12 ENCOUNTER — Ambulatory Visit: Payer: Medicaid Other | Admitting: Family Medicine

## 2019-12-12 ENCOUNTER — Other Ambulatory Visit: Payer: Self-pay

## 2019-12-12 ENCOUNTER — Ambulatory Visit (INDEPENDENT_AMBULATORY_CARE_PROVIDER_SITE_OTHER): Payer: Medicaid Other | Admitting: Family Medicine

## 2019-12-12 ENCOUNTER — Encounter: Payer: Self-pay | Admitting: Family Medicine

## 2019-12-12 VITALS — BP 119/85 | HR 91 | Temp 96.7°F | Resp 16 | Ht 65.0 in | Wt 170.0 lb

## 2019-12-12 DIAGNOSIS — F1994 Other psychoactive substance use, unspecified with psychoactive substance-induced mood disorder: Secondary | ICD-10-CM | POA: Diagnosis not present

## 2019-12-12 DIAGNOSIS — Z915 Personal history of self-harm: Secondary | ICD-10-CM | POA: Diagnosis not present

## 2019-12-12 DIAGNOSIS — Z9151 Personal history of suicidal behavior: Secondary | ICD-10-CM

## 2019-12-12 DIAGNOSIS — F411 Generalized anxiety disorder: Secondary | ICD-10-CM | POA: Diagnosis not present

## 2019-12-12 DIAGNOSIS — F332 Major depressive disorder, recurrent severe without psychotic features: Secondary | ICD-10-CM

## 2019-12-12 MED ORDER — HYDROXYZINE HCL 10 MG PO TABS: 10.0000 mg | ORAL_TABLET | Freq: Three times a day (TID) | ORAL | 3 refills | Status: DC | PRN

## 2019-12-12 NOTE — Progress Notes (Signed)
Patient: Melinda Olsen Female    DOB: 11/16/1975   44 y.o.   MRN: [id number] Visit Date: 12/12/2019  Today's Provider: Shirlee Latch, MD   Chief Complaint  Patient presents with  . Anxiety   Subjective:    I [place] S. Dimas, CMA, am acting as scribe for Shirlee Latch, MD.  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 NP Lord from the psychiatry service has prescribed patient with an alternate antidepressant. Patient reports she only took one dose of EFFEXOR-XR 37.5 mg daily.  Anxiety Presents for initial visit. Onset was in the past 7 days. The problem has been waxing and waning. Symptoms include chest pain, compulsions, confusion, decreased concentration, dizziness, dry mouth, hyperventilation, irritability, nausea, nervous/anxious behavior, palpitations, panic, restlessness, shortness of breath and suicidal ideas. Patient reports no depressed mood, excessive worry, feeling of choking, impotence, insomnia, malaise, muscle tension or obsessions. Symptoms occur constantly. The severity of symptoms is severe, causing significant distress and interfering with daily activities. The symptoms are aggravated by social activities. The quality of sleep is non-restorative. Nighttime awakenings: several.   Risk factors include change in medication and illicit drug use. Her past medical history is significant for anxiety/panic attacks and depression. Compliance with prior treatments has been poor.   Depression screen [facility] 2/9 12/12/2019 08/22/2019 05/23/2019  Decreased Interest 3 2 2   Down, Depressed, Hopeless 3 2 2   PHQ - 2 Score 6 4 4   Altered sleeping 3 3 3   Tired,  decreased energy 3 2 3   Change in appetite 3 1 0  Feeling bad or failure about yourself  0 2 2  Trouble concentrating 3 1 1   Moving slowly or fidgety/restless 3 1 1   Suicidal thoughts 0 1 2  PHQ-9 Score 21 15 16   Difficult doing work/chores Extremely dIfficult Somewhat difficult Somewhat difficult   GAD 7 : Generalized Anxiety Score 12/12/2019 05/23/2019  Nervous, Anxious, on Edge 3 3  Control/stop worrying 3 2  Worry too much - different things 0 3  Trouble relaxing 3 2  Restless 3 1  Easily annoyed or irritable 3 3  Afraid - awful might happen 3 1  Total GAD 7 Score 18 15  Anxiety Difficulty Extremely difficult Very difficult      No Known Allergies   Current Outpatient Medications:  .  albuterol (PROVENTIL HFA;VENTOLIN HFA) 108 (90 Base) MCG/ACT inhaler, Inhale 2 puffs into the lungs every 4 (four) hours as needed for wheezing or shortness of breath., Disp: 1 Inhaler, Rfl: 0 .  ondansetron (ZOFRAN ODT) 8 MG disintegrating tablet, Take 1 tablet (8 mg total) by mouth every 8 (eight) hours as needed for nausea or vomiting., Disp: 10 tablet, Rfl: 0 .  hydrOXYzine (ATARAX/VISTARIL) 10 MG tablet, Take 1 tablet (10 mg total) by mouth 3 (three) times daily as needed., Disp: 60 tablet, Rfl: 3 .  venlafaxine XR (EFFEXOR-XR) 37.5 MG 24 hr capsule, Take 1 capsule (37.5 mg total) by mouth daily with breakfast. (Patient not taking: Reported on 12/12/2019), Disp: 60 capsule, Rfl: 1  Review of Systems  Constitutional: Positive for activity change, appetite change, fatigue and irritability. Negative for chills.  Respiratory: Positive for  chest tightness and shortness of breath. Negative for cough and choking.   Cardiovascular: Positive for chest pain and palpitations.  Gastrointestinal: Positive for nausea.  Genitourinary: Negative for impotence.  Neurological: Positive for dizziness.  Psychiatric/Behavioral: Positive for agitation, behavioral problems, confusion, decreased concentration, sleep  disturbance and suicidal ideas. The patient is nervous/anxious. The patient does not have insomnia.     Social History   Tobacco Use  . Smoking status: Current Every Day Smoker    Packs/day: 1.00    Years: 23.00    Pack years: 23.00    Types: Cigarettes  . Smokeless tobacco: Never Used  Substance Use Topics  . Alcohol use: Yes    Alcohol/week: 2.0 standard drinks    Types: 2 Cans of beer per week    Comment: once weekly      Objective:   BP 119/85 (BP Location: Left Arm, Patient Position: Sitting, Cuff Size: Large)   Pulse 91   Temp (!) 96.7 F (35.9 C) (Temporal)   Resp 16   Ht 5\' 5"  (1.651 m)   Wt 170 lb (77.1 kg)   LMP 11/29/2017 (Exact Date)   BMI 28.29 kg/m  Vitals:   12/12/19 0814  BP: 119/85  Pulse: 91  Resp: 16  Temp: (!) 96.7 F (35.9 C)  TempSrc: Temporal  Weight: 170 lb (77.1 kg)  Height: 5\' 5"  (1.651 m)  Body mass index is 28.29 kg/m.   Physical Exam Vitals reviewed.  Constitutional:      General: She is not in acute distress.    Appearance: Normal appearance. She is well-developed. She is not diaphoretic.  HENT:     Head: Normocephalic and atraumatic.  Eyes:     General: No scleral icterus.    Conjunctiva/sclera: Conjunctivae normal.  Neck:     Thyroid: No thyromegaly.  Cardiovascular:     Rate and Rhythm: Normal rate and regular rhythm.     Pulses: Normal pulses.     Heart sounds: Normal heart sounds. No murmur.  Pulmonary:     Effort: Pulmonary effort is normal. No respiratory distress.     Breath sounds: Normal breath sounds. No wheezing, rhonchi or rales.  Musculoskeletal:     Cervical back: Neck supple.     Right lower leg: No edema.     Left lower leg: No edema.  Lymphadenopathy:     Cervical: No cervical adenopathy.  Skin:    General: Skin is warm and dry.     Capillary Refill: Capillary refill takes less than 2 seconds.     Findings: No rash.     Comments: Mild erythema in L AC from previous IV, no signs of infection   Neurological:     Mental Status: She is alert and oriented to person, place, and time. Mental status is at baseline.  Psychiatric:        Mood and Affect: Mood normal.        Behavior: Behavior normal.      No results found for any visits on 12/12/19.     Assessment & Plan    1. Severe episode of recurrent major depressive disorder, without psychotic features (HCC) 2. History of suicide attempt 3. GAD (generalized anxiety disorder) 4. Mood disorder, drug-induced (HCC) - patient with significant Psych history with recent worsening of depression and anxiety - she has also had use of illicit substances recently - encouraged cessatiopn as these are likely to worsen mood disorder -Concern for possible bipolar disorder given fluctuations between depression, but  this may be related to substance use -Would continue Effexor as was started by psychiatry in the hospital -After 1 week of 37.5 mg daily, she can increase to 75 mg daily -Avoid benzos at this time given other substance use -Trial of hydroxyzine as needed for panic symptoms -Referral to psychiatry for further evaluation and management -Encourage therapy, but patient declines at this time -We will follow-up in 1 month and repeat PHQ-9 and GAD-7 at that time, and less she is set up with psychiatry before then - Ambulatory referral to Psychiatry   Meds ordered this encounter  Medications  . hydrOXYzine (ATARAX/VISTARIL) 10 MG tablet    Sig: Take 1 tablet (10 mg total) by mouth 3 (three) times daily as needed.    Dispense:  60 tablet    Refill:  3     Return in about 4 weeks (around 01/09/2020) for MDD/GAD f/u.   The entirety of the information documented in the History of Present Illness, Review of Systems and Physical Exam were personally obtained by me. Portions of this information were initially documented by Lynford Humphrey, CMA and reviewed by me for thoroughness and accuracy.    Bacigalupo, Dionne Bucy, MD MPH [facility]

## 2019-12-12 NOTE — Patient Instructions (Signed)

## 2019-12-14 ENCOUNTER — Encounter: Payer: Self-pay | Admitting: Family Medicine

## 2019-12-25 ENCOUNTER — Other Ambulatory Visit: Payer: Self-pay

## 2019-12-25 ENCOUNTER — Emergency Department
Admission: EM | Admit: 2019-12-25 | Discharge: 2019-12-25 | Disposition: A | Discharge status: Home / Self Care | Payer: Medicaid Other | Attending: Emergency Medicine | Admitting: Emergency Medicine

## 2019-12-25 ENCOUNTER — Telehealth: Payer: Self-pay

## 2019-12-25 ENCOUNTER — Emergency Department: Payer: Medicaid Other

## 2019-12-25 DIAGNOSIS — F17210 Nicotine dependence, cigarettes, uncomplicated: Secondary | ICD-10-CM | POA: Diagnosis not present

## 2019-12-25 DIAGNOSIS — M79671 Pain in right foot: Secondary | ICD-10-CM | POA: Diagnosis not present

## 2019-12-25 DIAGNOSIS — S99921A Unspecified injury of right foot, initial encounter: Secondary | ICD-10-CM | POA: Diagnosis not present

## 2019-12-25 DIAGNOSIS — J45909 Unspecified asthma, uncomplicated: Secondary | ICD-10-CM | POA: Diagnosis not present

## 2019-12-25 NOTE — ED Triage Notes (Signed)
Pt arrives to ED via POV from home with c/o right foot pain x2 weeks after "kicking a dishwasher while dancing"; CMS intact. Pt is ambulatory with steady gait, in NAD; RR even, regular, and unlabored.

## 2019-12-25 NOTE — Telephone Encounter (Signed)
Copied from CRM [id number]. Topic: General - Other >> Dec 25, 2019  2:28 PM Laural Benes, [place] C wrote: Reason for CRM:  pt called in to request a referral for her right foot. Pt says that she called location to schedule and was told that a referral is needed. Pt would like to see Levonne Lapping on [address] Phone: [phone]

## 2019-12-25 NOTE — ED Provider Notes (Signed)
[facility] Emergency Department Provider Note  ____________________________________________   First MD Initiated Contact with Patient 12/25/19 0259     (approximate)  I have reviewed the triage vital signs and the nursing notes.   HISTORY  Chief Complaint Foot Pain    HPI Melinda Olsen is a 44 y.o. female with medical and psychiatric history as listed below  who presents for evaluation of ongoing and worsening pain in her right foot for the last 2 weeks.  She says she accidentally kicked a dishwasher while dancing 2 weeks ago and she has had a steady and gradual increase of pain since then.  The pain is mostly on the outside of the foot as well as in her heel.  She has been told in the past that she has plantar fasciitis but this feels different.  No swelling, no numbness or tingling.  No other recent symptoms including no fever, chest pain or shortness of breath.  She says the pain is worse at the end of the day after being on her feet all day at work, somewhat better after resting overnight.        Past Medical History:  Diagnosis Date  . Anxiety    NO MEDS  . Asthma    WELL CONTROLLED  . Depression    NO MEDS  . Dyspnea    PT STATES CHRONIC SOB (SMOKER) BUT STATES SHE CAN WALK A MILE WITHOUT GETTING SOB OR HAVING CHEST PAIN  . Endometriosis   . Family history of adverse reaction to anesthesia    DAD-CAN HEAR SURGEONS TALKING DURING SURGERY BUT NEVER FEELS ANYTHING  . Seizures ([place]) 2016   X2 -NO MEDS    Patient Active Problem List   Diagnosis Date Noted  . Major depressive disorder, recurrent episode, moderate ([place]) 12/09/2019  . Mixed hyperlipidemia 08/22/2019  . Acute left-sided low back pain with left-sided sciatica 08/22/2019  . Discharge from left nipple 06/15/2019  . Bilateral leg cramps 05/25/2019  . Plantar fasciitis 05/25/2019  . Radicular pain in right arm 05/25/2019  . Suprapubic pain 05/25/2019  . Urinary urgency  05/25/2019  . Tobacco use disorder 05/25/2019  . Mild intermittent asthma without complication 69/79/4801  . History of suicide attempt 05/25/2019  . MDD (major depressive disorder) 05/25/2019  . Adenomyosis 12/01/2017  . Dysmenorrhea 11/09/2017  . Galactorrhea 11/09/2017  . Chronic pelvic pain in female 11/09/2017    Past Surgical History:  Procedure Laterality Date  . BREAST DUCTAL SYSTEM EXCISION Left 08/01/2019   Procedure: EXCISION DUCTAL SYSTEM BREAST;  Surgeon: Olean Ree, MD;  Location: [facility] ORS;  Service: General;  Laterality: Left;  . CESAREAN SECTION     X2  . CYSTOSCOPY  12/22/2017   Procedure: CYSTOSCOPY;  Surgeon: Gae Dry, MD;  Location: [facility] ORS;  Service: Gynecology;;  . LAPAROSCOPIC HYSTERECTOMY Bilateral 12/22/2017   Procedure: HYSTERECTOMY TOTAL LAPAROSCOPIC BILATERAL SALPINGECTOMY;  Surgeon: Gae Dry, MD;  Location: [facility] ORS;  Service: Gynecology;  Laterality: Bilateral;  . TUBAL LIGATION      Prior to Admission medications   Medication Sig Start Date End Date Taking? Authorizing Provider  albuterol (PROVENTIL HFA;VENTOLIN HFA) 108 (90 Base) MCG/ACT inhaler Inhale 2 puffs into the lungs every 4 (four) hours as needed for wheezing or shortness of breath. 11/23/18   Cuthriell, Charline Bills, PA-C  hydrOXYzine (ATARAX/VISTARIL) 10 MG tablet Take 1 tablet (10 mg total) by mouth 3 (three) times daily as needed. 12/12/19   Virginia Crews,  MD  ondansetron (ZOFRAN ODT) 8 MG disintegrating tablet Take 1 tablet (8 mg total) by mouth every 8 (eight) hours as needed for nausea or vomiting. 12/09/19   Dionne Bucy, MD  venlafaxine XR (EFFEXOR-XR) 37.5 MG 24 hr capsule Take 1 capsule (37.5 mg total) by mouth daily with breakfast. Patient not taking: Reported on 12/12/2019 12/10/19   Charm Rings, NP    Allergies Patient has no known allergies.  Family History  Problem Relation Age of Onset  . Throat cancer Mother   . Diabetes Mother   . Breast  cancer Paternal Aunt   . Cervical cancer Paternal Aunt   . Diabetes Paternal Aunt   . Lung cancer Maternal Grandfather   . Heart attack Paternal Grandmother   . Diabetes Paternal Grandmother   . Lung cancer Paternal Aunt   . Diabetes Paternal Aunt     Social History Social History   Tobacco Use  . Smoking status: Current Every Day Smoker    Packs/day: 1.00    Years: 23.00    Pack years: 23.00    Types: Cigarettes  . Smokeless tobacco: Never Used  Substance Use Topics  . Alcohol use: Yes    Alcohol/week: 2.0 standard drinks    Types: 2 Cans of beer per week    Comment: once weekly  . Drug use: No    Review of Systems Constitutional: No fever/chills Eyes: No visual changes. Cardiovascular: Denies chest pain. Respiratory: Denies shortness of breath. Musculoskeletal: Right foot pain for 2 weeks as described above. Neurological: Negative for headaches, focal weakness or numbness.   ____________________________________________   PHYSICAL EXAM:  VITAL SIGNS: ED Triage Vitals  Enc Vitals Group     BP 12/25/19 0258 117/81     Pulse Rate 12/25/19 0258 76     Resp 12/25/19 0258 17     Temp 12/25/19 0258 98 F (36.7 C)     Temp Source 12/25/19 0258 Oral     SpO2 12/25/19 0258 99 %     Weight 12/25/19 0255 75.8 kg (167 lb)     Height 12/25/19 0255 1.676 m (5\' 6" )     Head Circumference --      Peak Flow --      Pain Score 12/25/19 0255 9     Pain Loc --      Pain Edu? --      Excl. in GC? --     Constitutional: Alert and oriented.  No acute distress at this time. Eyes: Conjunctivae are normal.  Head: Atraumatic. Cardiovascular: Normal rate, regular rhythm. Good peripheral circulation.  Respiratory: Normal respiratory effort.  No retractions. Musculoskeletal: No deformities of the right foot nor ankle are noted.  No swelling, no erythema, no wounds.  No point tenderness to palpation. Neurologic:  Normal speech and language. No gross focal neurologic deficits are  appreciated.  Skin:  Skin is warm, dry and intact. Psychiatric: Mood and affect are normal. Speech and behavior are normal.  ____________________________________________   LABS (all labs ordered are listed, but only abnormal results are displayed)  Labs Reviewed - No data to display ____________________________________________  EKG  No indication for EKG ____________________________________________  RADIOLOGY I, 02/24/20, personally viewed and evaluated these images (plain radiographs) as part of my medical decision making, as well as reviewing the written report by the radiologist.  ED MD interpretation:  No acute/emergent abnormalities identified  Official radiology report(s): DG Foot Complete Right  Result Date: 12/25/2019 CLINICAL DATA:  Foot pain following  kicking a dishwasher 2 weeks ago, initial encounter EXAM: RIGHT FOOT COMPLETE - 3+ VIEW COMPARISON:  None. FINDINGS: There is no evidence of fracture or dislocation. There is no evidence of arthropathy or other focal bone abnormality. Soft tissues are unremarkable. IMPRESSION: No acute abnormality noted. Electronically Signed   By: Alcide Clever M.D.   On: 12/25/2019 03:36    ____________________________________________   PROCEDURES   Procedure(s) performed (including Critical Care):  Procedures   ____________________________________________   INITIAL IMPRESSION / MDM / ASSESSMENT AND PLAN / ED COURSE  As part of my medical decision making, I reviewed the following data within the electronic MEDICAL RECORD NUMBER Nursing notes reviewed and incorporated, Labs reviewed , Radiograph reviewed , Notes from prior ED visits and [place] Controlled Substance Database   No evidence of fracture or dislocation, no evidence of acute or emergent medical condition.  We discussed musculoskeletal pain management and follow-up with podiatry.  I encouraged rest, cold packs, ibuprofen and Tylenol according to label instructions.  Medication  for a boot or splint.  Patient agrees with the plan for podiatry follow-up.          ____________________________________________  FINAL CLINICAL IMPRESSION(S) / ED DIAGNOSES  Final diagnoses:  Foot pain, right     MEDICATIONS GIVEN DURING THIS VISIT:  Medications - No data to display   ED Discharge Orders    None      *Please note:  Sarye Kath was evaluated in Emergency Department on 12/25/2019 for the symptoms described in the history of present illness. She was evaluated in the context of the global COVID-19 pandemic, which necessitated consideration that the patient might be at risk for infection with the SARS-CoV-2 virus that causes COVID-19. Institutional protocols and algorithms that pertain to the evaluation of patients at risk for COVID-19 are in a state of rapid change based on information released by regulatory bodies including the CDC and federal and state organizations. These policies and algorithms were followed during the patient's care in the ED.  Some ED evaluations and interventions may be delayed as a result of limited staffing during the pandemic.*  Note:  This document was prepared using Dragon voice recognition software and may include unintentional dictation errors.   Loleta Rose, MD 12/25/19 [phone]

## 2019-12-25 NOTE — Telephone Encounter (Signed)
Please advise. Patient was seen at [facility] ER on 12/25/2019.

## 2019-12-25 NOTE — Discharge Instructions (Signed)
Your x-rays were normal tonight.  Please follow up with Dr. Ether Griffins or one of his podiatry colleagues regarding your on-going foot pain.  Use over-the-counter ibuprofen and/or Tylenol according to label instructions.

## 2019-12-26 ENCOUNTER — Encounter: Payer: Self-pay | Admitting: Family Medicine

## 2019-12-26 NOTE — Addendum Note (Signed)
Addended by: Hyacinth Meeker on: 12/26/2019 04:44 PM   Modules accepted: Orders

## 2019-12-26 NOTE — Telephone Encounter (Signed)
ER recommended referral to podiatry, which is okay to place.  We can put her preference in the comment section.

## 2020-01-08 NOTE — Progress Notes (Signed)
Patient: Melinda Olsen Female    DOB: [birth year]   44 y.o.   MRN: [id number] Visit Date: 01/09/2020  Today's Provider: Lavon Paganini, MD   Chief Complaint  Patient presents with  . Depression  . Cough   Subjective:     HPI  Depression, Follow-up  She  was last seen for this 1 months ago. Changes made at last visit include continue Effexor 37.5mg  1 week then 75mg  daily as prescribed by psychiatry in the hospital. Trial of hydoxyzine as needed for panic symptoms. Referral to psychiatry.    She reports excellent compliance with treatment. She is having side effects. fatigue  Current symptoms include: depressed mood and suicidal thoughts without plan She feels she is Worse since last visit.  States that she will not kill herself even though she thinks about it because of her 2 children.  She has already reached out to the suicide hotline.  She is also reach out to psychiatry to see if she could be seen earlier, but was unable to.  She is currently taking Effexor 37.5 mg XR twice daily ------------------------------------------------------------------------  Depression screen [facility] 2/9 12/12/2019 08/22/2019 05/23/2019  Decreased Interest 3 2 2   Down, Depressed, Hopeless 3 2 2   PHQ - 2 Score 6 4 4   Altered sleeping 3 3 3   Tired, decreased energy 3 2 3   Change in appetite 3 1 0  Feeling bad or failure about yourself  0 2 2  Trouble concentrating 3 1 1   Moving slowly or fidgety/restless 3 1 1   Suicidal thoughts 0 1 2  PHQ-9 Score 21 15 16   Difficult doing work/chores Extremely dIfficult Somewhat difficult Somewhat difficult    GAD 7 : Generalized Anxiety Score 12/12/2019 05/23/2019  Nervous, Anxious, on Edge 3 3  Control/stop worrying 3 2  Worry too much - different things 0 3  Trouble relaxing 3 2  Restless 3 1  Easily annoyed or irritable 3 3  Afraid - awful might happen 3 1  Total GAD 7 Score 18 15  Anxiety Difficulty Extremely difficult Very difficult    Also reports chronic cough and wheezing.  Continues to smoke and is not interested in quitting at this time.  States that with her stress also high that she would not be successful in quitting smoking.  Believes that she may have COPD.  Is taking albuterol multiple times per day  No Known Allergies   Current Outpatient Medications:  .  albuterol (PROVENTIL HFA;VENTOLIN HFA) 108 (90 Base) MCG/ACT inhaler, Inhale 2 puffs into the lungs every 4 (four) hours as needed for wheezing or shortness of breath., Disp: 1 Inhaler, Rfl: 0 .  hydrOXYzine (ATARAX/VISTARIL) 10 MG tablet, Take 1 tablet (10 mg total) by mouth 3 (three) times daily as needed., Disp: 60 tablet, Rfl: 3 .  venlafaxine XR (EFFEXOR-XR) 37.5 MG 24 hr capsule, Take 1 capsule (37.5 mg total) by mouth daily with breakfast., Disp: 60 capsule, Rfl: 1 .  ondansetron (ZOFRAN ODT) 8 MG disintegrating tablet, Take 1 tablet (8 mg total) by mouth every 8 (eight) hours as needed for nausea or vomiting., Disp: 10 tablet, Rfl: 0  Review of Systems  Constitutional: Positive for fatigue. Negative for activity change, appetite change, chills, diaphoresis, fever and unexpected weight change.  Respiratory: Positive for cough, chest tightness, shortness of breath and wheezing. Negative for apnea, choking and stridor.   Gastrointestinal: Positive for abdominal pain. Negative for abdominal distention, anal bleeding, blood in stool, constipation,  diarrhea, nausea, rectal pain and vomiting.  Neurological: Negative for dizziness, light-headedness and headaches.  Psychiatric/Behavioral: Positive for agitation, decreased concentration, dysphoric mood, self-injury, sleep disturbance and suicidal ideas. The patient is nervous/anxious.     Social History   Tobacco Use  . Smoking status: Current Every Day Smoker    Packs/day: 1.00    Years: 23.00    Pack years: 23.00    Types: Cigarettes  . Smokeless tobacco: Never Used  Substance Use Topics  . Alcohol  use: Yes    Alcohol/week: 2.0 standard drinks    Types: 2 Cans of beer per week    Comment: once weekly      Objective:   BP 110/78 (BP Location: Left Arm, Patient Position: Sitting, Cuff Size: Large)   Pulse (!) 101   Temp (!) 96.6 F (35.9 C) (Temporal)   Wt 182 lb (82.6 kg)   LMP 11/29/2017 (Exact Date)   BMI 29.38 kg/m  Vitals:   01/09/20 0819  BP: 110/78  Pulse: (!) 101  Temp: (!) 96.6 F (35.9 C)  TempSrc: Temporal  Weight: 182 lb (82.6 kg)  Body mass index is 29.38 kg/m.   Physical Exam Vitals reviewed.  Constitutional:      General: She is not in acute distress.    Appearance: Normal appearance. She is well-developed. She is not diaphoretic.  HENT:     Head: Normocephalic and atraumatic.  Eyes:     General: No scleral icterus.    Conjunctiva/sclera: Conjunctivae normal.  Neck:     Thyroid: No thyromegaly.  Cardiovascular:     Rate and Rhythm: Normal rate and regular rhythm.     Pulses: Normal pulses.     Heart sounds: Normal heart sounds. No murmur.  Pulmonary:     Effort: Pulmonary effort is normal. No respiratory distress.     Comments: Diffuse rhonchorous breath sounds with wheezing Musculoskeletal:     Cervical back: Neck supple.     Right lower leg: No edema.     Left lower leg: No edema.  Lymphadenopathy:     Cervical: No cervical adenopathy.  Skin:    General: Skin is warm and dry.     Findings: No rash.  Neurological:     Mental Status: She is alert and oriented to person, place, and time. Mental status is at baseline.  Psychiatric:        Mood and Affect: Mood is depressed. Affect is blunt.        Speech: Speech normal.        Behavior: Behavior is not agitated, aggressive or withdrawn. Behavior is cooperative.        Thought Content: Thought content includes suicidal ideation. Thought content does not include homicidal ideation. Thought content does not include homicidal or suicidal plan.      No results found for any visits on  01/09/20.     Assessment & Plan    Problem List Items Addressed This Visit      Respiratory   Mucopurulent chronic bronchitis (HCC)    Suspect COPD given lung exam and ongoing tobacco abuse  Will start spiriva Advised on smoking cessation - she is precontemplative Continue albuteorl prn        Other   History of suicide attempt    H/o intentional overdose with Wellbutrin Contracted for safety - no active SI Discussed f/u with psych Offered LCSW CCM and patient declined Discussed if she develops active SI to go to ED Increase Effexor XR to  150mg  daily       MDD (major depressive disorder) - Primary    Uncontrolled - worsening Contracted for safety - no active SI Discussed f/u with psych Offered LCSW CCM and patient declined Discussed if she develops active SI to go to ED Increase Effexor XR to 150mg  daily      Relevant Medications   venlafaxine XR (EFFEXOR-XR) 150 MG 24 hr capsule   Passive suicidal ideations    H/o intentional overdose with Wellbutrin Contracted for safety - no active SI Discussed f/u with psych Offered LCSW CCM and patient declined Discussed if she develops active SI to go to ED Increase Effexor XR to 150mg  daily       Other Visit Diagnoses    Vaccine counseling        - discussed importance of COVID19 vaccine - discussed MOA and safety - patient to call to set up appt for vaccination   Return in about 2 months (around 03/10/2020) for COPD f/u.   The entirety of the information documented in the History of Present Illness, Review of Systems and Physical Exam were personally obtained by me. Portions of this information were initially documented by , CMA and reviewed by me for thoroughness and accuracy.    Melinda Olsen, , MD MPH [facility]

## 2020-01-09 ENCOUNTER — Encounter: Payer: Self-pay | Admitting: Family Medicine

## 2020-01-09 ENCOUNTER — Ambulatory Visit: Payer: Medicaid Other | Admitting: Family Medicine

## 2020-01-09 ENCOUNTER — Other Ambulatory Visit: Payer: Self-pay

## 2020-01-09 VITALS — BP 110/78 | HR 101 | Temp 96.6°F | Wt 182.0 lb

## 2020-01-09 DIAGNOSIS — Z9151 Personal history of suicidal behavior: Secondary | ICD-10-CM

## 2020-01-09 DIAGNOSIS — R45851 Suicidal ideations: Secondary | ICD-10-CM | POA: Insufficient documentation

## 2020-01-09 DIAGNOSIS — Z7185 Encounter for immunization safety counseling: Secondary | ICD-10-CM

## 2020-01-09 DIAGNOSIS — J411 Mucopurulent chronic bronchitis: Secondary | ICD-10-CM

## 2020-01-09 DIAGNOSIS — Z7189 Other specified counseling: Secondary | ICD-10-CM | POA: Diagnosis not present

## 2020-01-09 DIAGNOSIS — F332 Major depressive disorder, recurrent severe without psychotic features: Secondary | ICD-10-CM

## 2020-01-09 DIAGNOSIS — Z915 Personal history of self-harm: Secondary | ICD-10-CM

## 2020-01-09 MED ORDER — VENLAFAXINE HCL ER 150 MG PO CP24: 150.0000 mg | ORAL_CAPSULE | Freq: Every day | ORAL | 2 refills | Status: DC

## 2020-01-09 MED ORDER — SPIRIVA HANDIHALER 18 MCG IN CAPS: 18.0000 ug | ORAL_CAPSULE | Freq: Every day | RESPIRATORY_TRACT | 12 refills | Status: DC

## 2020-01-09 MED ORDER — VENLAFAXINE HCL ER 75 MG PO CP24: 75.0000 mg | ORAL_CAPSULE | Freq: Every day | ORAL | 2 refills | Status: DC

## 2020-01-09 NOTE — Assessment & Plan Note (Signed)
H/o intentional overdose with Wellbutrin Contracted for safety - no active SI Discussed f/u with psych Offered LCSW CCM and patient declined Discussed if she develops active SI to go to ED Increase Effexor XR to 150mg  daily

## 2020-01-09 NOTE — Patient Instructions (Signed)
Chronic Obstructive Pulmonary Disease Chronic obstructive pulmonary disease (COPD) is a long-term (chronic) lung problem. When you have COPD, it is hard for air to get in and out of your lungs. Usually the condition gets worse over time, and your lungs will never return to normal. There are things you can do to keep yourself as healthy as possible.  Your doctor may treat your condition with: ? Medicines. ? Oxygen. ? Lung surgery.  Your doctor may also recommend: ? Rehabilitation. This includes steps to make your body work better. It may involve a team of specialists. ? Quitting smoking, if you smoke. ? Exercise and changes to your diet. ? Comfort measures (palliative care). Follow these instructions at home: Medicines  Take over-the-counter and prescription medicines only as told by your doctor.  Talk to your doctor before taking any cough or allergy medicines. You may need to avoid medicines that cause your lungs to be dry. Lifestyle  If you smoke, stop. Smoking makes the problem worse. If you need help quitting, ask your doctor.  Avoid being around things that make your breathing worse. This may include smoke, chemicals, and fumes.  Stay active, but remember to rest as well.  Learn and use tips on how to relax.  Make sure you get enough sleep. Most adults need at least 7 hours of sleep every night.  Eat healthy foods. Eat smaller meals more often. Rest before meals. Controlled breathing Learn and use tips on how to control your breathing as told by your doctor. Try:  Breathing in (inhaling) through your nose for 1 second. Then, pucker your lips and breath out (exhale) through your lips for 2 seconds.  Putting one hand on your belly (abdomen). Breathe in slowly through your nose for 1 second. Your hand on your belly should move out. Pucker your lips and breathe out slowly through your lips. Your hand on your belly should move in as you breathe out.  Controlled coughing Learn  and use controlled coughing to clear mucus from your lungs. Follow these steps: 1. Lean your head a little forward. 2. Breathe in deeply. 3. Try to hold your breath for 3 seconds. 4. Keep your mouth slightly open while coughing 2 times. 5. Spit any mucus out into a tissue. 6. Rest and do the steps again 1 or 2 times as needed. General instructions  Make sure you get all the shots (vaccines) that your doctor recommends. Ask your doctor about a flu shot and a pneumonia shot.  Use oxygen therapy and pulmonary rehabilitation if told by your doctor. If you need home oxygen therapy, ask your doctor if you should buy a tool to measure your oxygen level (oximeter).  Make a COPD action plan with your doctor. This helps you to know what to do if you feel worse than usual.  Manage any other conditions you have as told by your doctor.  Avoid going outside when it is very hot, cold, or humid.  Avoid people who have a sickness you can catch (contagious).  Keep all follow-up visits as told by your doctor. This is important. Contact a doctor if:  You cough up more mucus than usual.  There is a change in the color or thickness of the mucus.  It is harder to breathe than usual.  Your breathing is faster than usual.  You have trouble sleeping.  You need to use your medicines more often than usual.  You have trouble doing your normal activities such as getting dressed   or walking around the house. Get help right away if:  You have shortness of breath while resting.  You have shortness of breath that stops you from: ? Being able to talk. ? Doing normal activities.  Your chest hurts for longer than 5 minutes.  Your skin color is more blue than usual.  Your pulse oximeter shows that you have low oxygen for longer than 5 minutes.  You have a fever.  You feel too tired to breathe normally. Summary  Chronic obstructive pulmonary disease (COPD) is a long-term lung problem.  The way your  lungs work will never return to normal. Usually the condition gets worse over time. There are things you can do to keep yourself as healthy as possible.  Take over-the-counter and prescription medicines only as told by your doctor.  If you smoke, stop. Smoking makes the problem worse. This information is not intended to replace advice given to you by your health care provider. Make sure you discuss any questions you have with your health care provider. Document Revised: 09/23/2017 Document Reviewed: 11/15/2016 Elsevier Patient Education  2020 [employer] Inc[employer]  

## 2020-01-09 NOTE — Assessment & Plan Note (Signed)
Suspect COPD given lung exam and ongoing tobacco abuse  Will start spiriva Advised on smoking cessation - she is precontemplative Continue albuteorl prn

## 2020-01-09 NOTE — Assessment & Plan Note (Signed)
Uncontrolled - worsening Contracted for safety - no active SI Discussed f/u with psych Offered LCSW CCM and patient declined Discussed if she develops active SI to go to ED Increase Effexor XR to 150mg  daily

## 2020-01-09 NOTE — Assessment & Plan Note (Signed)
H/o intentional overdose with Wellbutrin Contracted for safety - no active SI Discussed f/u with psych Offered LCSW CCM and patient declined Discussed if she develops active SI to go to ED Increase Effexor XR to 150mg daily  

## 2020-01-23 ENCOUNTER — Other Ambulatory Visit: Payer: Self-pay

## 2020-01-23 ENCOUNTER — Ambulatory Visit: Payer: Medicaid Other | Admitting: Psychiatry

## 2020-02-12 ENCOUNTER — Emergency Department: Payer: Medicaid Other

## 2020-02-12 ENCOUNTER — Other Ambulatory Visit: Payer: Self-pay

## 2020-02-12 ENCOUNTER — Emergency Department
Admission: EM | Admit: 2020-02-12 | Discharge: 2020-02-12 | Disposition: A | Discharge status: Home / Self Care | Payer: Medicaid Other | Attending: Emergency Medicine | Admitting: Emergency Medicine

## 2020-02-12 ENCOUNTER — Encounter: Payer: Self-pay | Admitting: Emergency Medicine

## 2020-02-12 DIAGNOSIS — J45909 Unspecified asthma, uncomplicated: Secondary | ICD-10-CM | POA: Diagnosis not present

## 2020-02-12 DIAGNOSIS — R0602 Shortness of breath: Secondary | ICD-10-CM | POA: Diagnosis present

## 2020-02-12 DIAGNOSIS — J9801 Acute bronchospasm: Secondary | ICD-10-CM | POA: Diagnosis not present

## 2020-02-12 DIAGNOSIS — F17210 Nicotine dependence, cigarettes, uncomplicated: Secondary | ICD-10-CM | POA: Insufficient documentation

## 2020-02-12 DIAGNOSIS — Z79899 Other long term (current) drug therapy: Secondary | ICD-10-CM | POA: Insufficient documentation

## 2020-02-12 DIAGNOSIS — R0789 Other chest pain: Secondary | ICD-10-CM | POA: Diagnosis not present

## 2020-02-12 LAB — BASIC METABOLIC PANEL
Anion gap: 5 (ref 5–15)
BUN: 6 mg/dL (ref 6–20)
CO2: 25 mmol/L (ref 22–32)
Calcium: 9 mg/dL (ref 8.9–10.3)
Chloride: 106 mmol/L (ref 98–111)
Creatinine, Ser: 0.72 mg/dL (ref 0.44–1.00)
GFR calc Af Amer: >60 mL/min (ref >60)
GFR calc non Af Amer: >60 mL/min (ref >60)
Glucose, Bld: 103 mg/dL — ABNORMAL HIGH (ref 70–99)
Potassium: 3.7 mmol/L (ref 3.5–5.1)
Sodium: 136 mmol/L (ref 135–145)

## 2020-02-12 LAB — CBC
HCT: 38.3 % (ref 36.0–46.0)
Hemoglobin: 13.1 g/dL (ref 12.0–15.0)
MCH: 32.9 pg (ref 26.0–34.0)
MCHC: 34.2 g/dL (ref 30.0–36.0)
MCV: 96.2 fL (ref 80.0–100.0)
Platelets: 309 10*3/uL (ref 150–400)
RBC: 3.98 MIL/uL (ref 3.87–5.11)
RDW: 12.8 % (ref 11.5–15.5)
WBC: 11.3 10*3/uL — ABNORMAL HIGH (ref 4.0–10.5)
nRBC: 0 % (ref 0.0–0.2)

## 2020-02-12 LAB — TROPONIN I (HIGH SENSITIVITY): Troponin I (High Sensitivity): <2 ng/L (ref <18)

## 2020-02-12 NOTE — ED Provider Notes (Signed)
[facility] Emergency Department Provider Note ____________________________________________   First MD Initiated Contact with Patient 02/12/20 1444     (approximate)  I have reviewed the triage vital signs and the nursing notes.   HISTORY  Chief Complaint Shortness of Breath, Chest Pain, and Cough    HPI Melinda Olsen is a 44 y.o. female with PMH as noted below including history of COPD who presents with shortness of breath, acute onset this morning, and now completely resolved.  The patient states that she had cough with some clear sputum.  She also reports some sharp pain in the front of her chest with coughing.  She denies any fever chills, vomiting, diarrhea, or weakness.  The patient states that the symptoms resolved while she was in the waiting room.  Past Medical History:  Diagnosis Date  . Anxiety    NO MEDS  . Asthma    WELL CONTROLLED  . Depression    NO MEDS  . Dyspnea    PT STATES CHRONIC SOB (SMOKER) BUT STATES SHE CAN WALK A MILE WITHOUT GETTING SOB OR HAVING CHEST PAIN  . Endometriosis   . Family history of adverse reaction to anesthesia    DAD-CAN HEAR SURGEONS TALKING DURING SURGERY BUT NEVER FEELS ANYTHING  . Seizures ([place]) 2016   X2 -NO MEDS    Patient Active Problem List   Diagnosis Date Noted  . Passive suicidal ideations 01/09/2020  . Mucopurulent chronic bronchitis ([place]) 01/09/2020  . Mixed hyperlipidemia 08/22/2019  . Acute left-sided low back pain with left-sided sciatica 08/22/2019  . Discharge from left nipple 06/15/2019  . Bilateral leg cramps 05/25/2019  . Plantar fasciitis 05/25/2019  . Radicular pain in right arm 05/25/2019  . Suprapubic pain 05/25/2019  . Urinary urgency 05/25/2019  . Tobacco use disorder 05/25/2019  . Mild intermittent asthma without complication 81/44/8185  . History of suicide attempt 05/25/2019  . MDD (major depressive disorder) 05/25/2019  . Adenomyosis 12/01/2017  . Dysmenorrhea  11/09/2017  . Galactorrhea 11/09/2017  . Chronic pelvic pain in female 11/09/2017    Past Surgical History:  Procedure Laterality Date  . BREAST DUCTAL SYSTEM EXCISION Left 08/01/2019   Procedure: EXCISION DUCTAL SYSTEM BREAST;  Surgeon: Olean Ree, MD;  Location: [facility] ORS;  Service: General;  Laterality: Left;  . CESAREAN SECTION     X2  . CYSTOSCOPY  12/22/2017   Procedure: CYSTOSCOPY;  Surgeon: Gae Dry, MD;  Location: [facility] ORS;  Service: Gynecology;;  . LAPAROSCOPIC HYSTERECTOMY Bilateral 12/22/2017   Procedure: HYSTERECTOMY TOTAL LAPAROSCOPIC BILATERAL SALPINGECTOMY;  Surgeon: Gae Dry, MD;  Location: [facility] ORS;  Service: Gynecology;  Laterality: Bilateral;  . TUBAL LIGATION      Prior to Admission medications   Medication Sig Start Date End Date Taking? Authorizing Provider  albuterol (PROVENTIL HFA;VENTOLIN HFA) 108 (90 Base) MCG/ACT inhaler Inhale 2 puffs into the lungs every 4 (four) hours as needed for wheezing or shortness of breath. 11/23/18   Cuthriell, Charline Bills, PA-C  hydrOXYzine (ATARAX/VISTARIL) 10 MG tablet Take 1 tablet (10 mg total) by mouth 3 (three) times daily as needed. 12/12/19   Virginia Crews, MD  tiotropium (SPIRIVA HANDIHALER) 18 MCG inhalation capsule Place 1 capsule (18 mcg total) into inhaler and inhale daily. 01/09/20   Virginia Crews, MD  venlafaxine XR (EFFEXOR-XR) 150 MG 24 hr capsule Take 1 capsule (150 mg total) by mouth daily with breakfast. 01/09/20   Bacigalupo, Dionne Bucy, MD    Allergies Patient  has no known allergies.  Family History  Problem Relation Age of Onset  . Throat cancer Mother   . Diabetes Mother   . Breast cancer Paternal Aunt   . Cervical cancer Paternal Aunt   . Diabetes Paternal Aunt   . Lung cancer Maternal Grandfather   . Heart attack Paternal Grandmother   . Diabetes Paternal Grandmother   . Lung cancer Paternal Aunt   . Diabetes Paternal Aunt     Social History Social History   Tobacco  Use  . Smoking status: Current Every Day Smoker    Packs/day: 1.00    Years: 23.00    Pack years: 23.00    Types: Cigarettes  . Smokeless tobacco: Never Used  Substance Use Topics  . Alcohol use: Yes    Alcohol/week: 2.0 standard drinks    Types: 2 Cans of beer per week    Comment: once weekly  . Drug use: No    Review of Systems  Constitutional: No fever. Eyes: No visual changes. ENT: No sore throat. Cardiovascular: Positive for resolved chest pain. Respiratory: Positive for resolved shortness of breath. Gastrointestinal: No vomiting or diarrhea.  Genitourinary: Negative for dysuria.  Musculoskeletal: Negative for back pain. Skin: Negative for rash. Neurological: Negative for headache.   ____________________________________________   PHYSICAL EXAM:  VITAL SIGNS: ED Triage Vitals  Enc Vitals Group     BP 02/12/20 1210 116/73     Pulse Rate 02/12/20 1210 (!) 102     Resp 02/12/20 1210 20     Temp 02/12/20 1210 98.3 F (36.8 C)     Temp Source 02/12/20 1210 Oral     SpO2 02/12/20 1210 97 %     Weight 02/12/20 1211 165 lb (74.8 kg)     Height 02/12/20 1211 5\' 6"  (1.676 m)     Head Circumference --      Peak Flow --      Pain Score 02/12/20 1211 0     Pain Loc --      Pain Edu? --      Excl. in GC? --     Constitutional: Alert and oriented. Well appearing and in no acute distress. Eyes: Conjunctivae are normal.  Head: Atraumatic. Nose: No congestion/rhinnorhea. Mouth/Throat: Mucous membranes are moist.   Neck: Normal range of motion.  Cardiovascular: Normal rate, regular rhythm. Grossly normal heart sounds.  Good peripheral circulation. Respiratory: Normal respiratory effort.  No retractions. Lungs CTAB. Gastrointestinal:  No distention.  Musculoskeletal: Extremities warm and well perfused.  Neurologic:  Normal speech and language. No gross focal neurologic deficits are appreciated.  Skin:  Skin is warm and dry. No rash noted. Psychiatric: Mood and affect  are normal. Speech and behavior are normal.  ____________________________________________   LABS (all labs ordered are listed, but only abnormal results are displayed)  Labs Reviewed  CBC - Abnormal; Notable for the following components:      Result Value   WBC 11.3 (*)    All other components within normal limits  BASIC METABOLIC PANEL - Abnormal; Notable for the following components:   Glucose, Bld 103 (*)    All other components within normal limits  TROPONIN I (HIGH SENSITIVITY)  TROPONIN I (HIGH SENSITIVITY)   ____________________________________________  EKG  ED ECG REPORT I, 02/14/20, the attending physician, personally viewed and interpreted this ECG.  Date: 02/12/2020 EKG Time: 1214 Rate: 82 Rhythm: normal sinus rhythm QRS Axis: normal Intervals: normal ST/T Wave abnormalities: normal Narrative Interpretation: no evidence of acute  ischemia  ____________________________________________  RADIOLOGY  CXR: COPD findings with no focal infiltrate or other acute abnormality  ____________________________________________   PROCEDURES  Procedure(s) performed: No  Procedures  Critical Care performed: No ____________________________________________   INITIAL IMPRESSION / ASSESSMENT AND PLAN / ED COURSE  Pertinent labs & imaging results that were available during my care of the patient were reviewed by me and considered in my medical decision making (see chart for details).  44 year old female with a history of COPD and other PMH as noted above presents with acute onset of shortness of breath, cough, and some atypical chest pain this morning.  The patient waited about 2.5 hours before being seen, and during that time the symptoms have resolved.  She is now asymptomatic and states she feels well and wants to go home.  On exam she is overall well-appearing with no increased work of breathing or respiratory distress.  Her vital signs are normal.  There is  no wheezing on lung exam.  Work-up is negative including a chest x-ray and troponin.  Given the highly atypical nature of the pain, its brief duration, the normal EKG and first troponin, and the lack of any ACS risk factors, there is no indication for repeat troponin.  I suspect overall most likely an episode of bronchospasm, acute bronchitis, or mild COPD flare.  The patient is stable for discharge.  I counseled her on the results of the work-up.  Return precautions given, and she expresses understanding.  ____________________________________________   FINAL CLINICAL IMPRESSION(S) / ED DIAGNOSES  Final diagnoses:  Bronchospasm      NEW MEDICATIONS STARTED DURING THIS VISIT:  Discharge Medication List as of 02/12/2020  2:55 PM       Note:  This document was prepared using Dragon voice recognition software and may include unintentional dictation errors.    Dionne Bucy, MD 02/12/20 1510

## 2020-02-12 NOTE — Discharge Instructions (Signed)
Continue take your regular COPD medications as prescribed.  Follow-up with your primary care doctor.  Return to the ER for new, worsening, or persistent severe shortness of breath, chest pain, or any other new or worsening symptoms that concern you.

## 2020-02-12 NOTE — ED Triage Notes (Signed)
Pt reports unsure if it is the pollen or what but this am started feeling SOB and has some pain in her chest when she cough or takes a deep breath. Pt reports does have COPD as well.

## 2020-03-10 ENCOUNTER — Encounter: Payer: Self-pay | Admitting: Family Medicine

## 2020-03-10 ENCOUNTER — Ambulatory Visit (INDEPENDENT_AMBULATORY_CARE_PROVIDER_SITE_OTHER): Payer: Medicaid Other | Admitting: Family Medicine

## 2020-03-10 ENCOUNTER — Other Ambulatory Visit: Payer: Self-pay

## 2020-03-10 VITALS — BP 104/72 | HR 87 | Temp 96.9°F | Wt 163.0 lb

## 2020-03-10 DIAGNOSIS — J411 Mucopurulent chronic bronchitis: Secondary | ICD-10-CM

## 2020-03-10 DIAGNOSIS — F17200 Nicotine dependence, unspecified, uncomplicated: Secondary | ICD-10-CM | POA: Diagnosis not present

## 2020-03-10 DIAGNOSIS — F332 Major depressive disorder, recurrent severe without psychotic features: Secondary | ICD-10-CM

## 2020-03-10 MED ORDER — VENLAFAXINE HCL ER 150 MG PO CP24: 150.0000 mg | ORAL_CAPSULE | Freq: Every day | ORAL | 1 refills | Status: DC

## 2020-03-10 MED ORDER — FLUTICASONE-SALMETEROL 250-50 MCG/DOSE IN AEPB: 1.0000 | INHALATION_SPRAY | Freq: Two times a day (BID) | RESPIRATORY_TRACT | 5 refills | Status: DC

## 2020-03-10 NOTE — Assessment & Plan Note (Addendum)
Uncontrolled Significant wheezing Needing albuterol multiple times per day Pt reports no improvement with Spiriva.  Will add Advair 250/42mcg to current treatment.  Advised on smoking cessation, pt is not ready to quit yet.  Continue albuterol prn Recheck in three months.

## 2020-03-10 NOTE — Assessment & Plan Note (Addendum)
Pt reports improvement with venlafaxine 150mg  a day.  She states her pharmacy refilled the 37.5mg  instead 150mg  so she has been taking two a day.  Pt's depression symptoms are worse with the decreased dose.  Pt missed her phone visit with psychiatry, advised her to reach out to them and reschedule.  Will restart venlafaxine 150mg  a day.  Recheck in 3 months.  Contract for safety.

## 2020-03-10 NOTE — Assessment & Plan Note (Signed)
Discussed the importance of tobacco cessation.  Pt states she is not ready yet.

## 2020-03-10 NOTE — Patient Instructions (Signed)
Chronic Obstructive Pulmonary Disease Chronic obstructive pulmonary disease (COPD) is a long-term (chronic) lung problem. When you have COPD, it is hard for air to get in and out of your lungs. Usually the condition gets worse over time, and your lungs will never return to normal. There are things you can do to keep yourself as healthy as possible.  Your doctor may treat your condition with: ? Medicines. ? Oxygen. ? Lung surgery.  Your doctor may also recommend: ? Rehabilitation. This includes steps to make your body work better. It may involve a team of specialists. ? Quitting smoking, if you smoke. ? Exercise and changes to your diet. ? Comfort measures (palliative care). Follow these instructions at home: Medicines  Take over-the-counter and prescription medicines only as told by your doctor.  Talk to your doctor before taking any cough or allergy medicines. You may need to avoid medicines that cause your lungs to be dry. Lifestyle  If you smoke, stop. Smoking makes the problem worse. If you need help quitting, ask your doctor.  Avoid being around things that make your breathing worse. This may include smoke, chemicals, and fumes.  Stay active, but remember to rest as well.  Learn and use tips on how to relax.  Make sure you get enough sleep. Most adults need at least 7 hours of sleep every night.  Eat healthy foods. Eat smaller meals more often. Rest before meals. Controlled breathing Learn and use tips on how to control your breathing as told by your doctor. Try:  Breathing in (inhaling) through your nose for 1 second. Then, pucker your lips and breath out (exhale) through your lips for 2 seconds.  Putting one hand on your belly (abdomen). Breathe in slowly through your nose for 1 second. Your hand on your belly should move out. Pucker your lips and breathe out slowly through your lips. Your hand on your belly should move in as you breathe out.  Controlled coughing Learn  and use controlled coughing to clear mucus from your lungs. Follow these steps: 1. Lean your head a little forward. 2. Breathe in deeply. 3. Try to hold your breath for 3 seconds. 4. Keep your mouth slightly open while coughing 2 times. 5. Spit any mucus out into a tissue. 6. Rest and do the steps again 1 or 2 times as needed. General instructions  Make sure you get all the shots (vaccines) that your doctor recommends. Ask your doctor about a flu shot and a pneumonia shot.  Use oxygen therapy and pulmonary rehabilitation if told by your doctor. If you need home oxygen therapy, ask your doctor if you should buy a tool to measure your oxygen level (oximeter).  Make a COPD action plan with your doctor. This helps you to know what to do if you feel worse than usual.  Manage any other conditions you have as told by your doctor.  Avoid going outside when it is very hot, cold, or humid.  Avoid people who have a sickness you can catch (contagious).  Keep all follow-up visits as told by your doctor. This is important. Contact a doctor if:  You cough up more mucus than usual.  There is a change in the color or thickness of the mucus.  It is harder to breathe than usual.  Your breathing is faster than usual.  You have trouble sleeping.  You need to use your medicines more often than usual.  You have trouble doing your normal activities such as getting dressed   or walking around the house. Get help right away if:  You have shortness of breath while resting.  You have shortness of breath that stops you from: ? Being able to talk. ? Doing normal activities.  Your chest hurts for longer than 5 minutes.  Your skin color is more blue than usual.  Your pulse oximeter shows that you have low oxygen for longer than 5 minutes.  You have a fever.  You feel too tired to breathe normally. Summary  Chronic obstructive pulmonary disease (COPD) is a long-term lung problem.  The way your  lungs work will never return to normal. Usually the condition gets worse over time. There are things you can do to keep yourself as healthy as possible.  Take over-the-counter and prescription medicines only as told by your doctor.  If you smoke, stop. Smoking makes the problem worse. This information is not intended to replace advice given to you by your health care provider. Make sure you discuss any questions you have with your health care provider. Document Revised: 09/23/2017 Document Reviewed: 11/15/2016 Elsevier Patient Education  2020 [employer] Inc[employer]  

## 2020-03-10 NOTE — Progress Notes (Signed)
I,Laura E Walsh,acting as a scribe for Shirlee Latch, MD.,have documented all relevant documentation on the behalf of Shirlee Latch, MD,as directed by  Shirlee Latch, MD while in the presence of Shirlee Latch, MD.   Established patient visit   Patient: Melinda Olsen   DOB: 1976/04/02   44 y.o. Female  MRN: [id number] Visit Date: 03/10/2020  Today's healthcare provider: Shirlee Latch, MD   Chief Complaint  Patient presents with  . COPD  . Depression   Subjective    Breathing Problem She complains of cough, difficulty breathing, shortness of breath and wheezing. This is a chronic problem. The problem has been unchanged. The cough is productive of sputum. Associated symptoms include dyspnea on exertion and heartburn. Pertinent negatives include no headaches, malaise/fatigue, nasal congestion, postnasal drip or sore throat. Her symptoms are aggravated by climbing stairs and strenuous activity. She reports no improvement on treatment. Her past medical history is significant for COPD.  Depression        This is a chronic problem.  The problem occurs constantly.  The problem has been gradually worsening since onset.  Associated symptoms include helplessness, hopelessness, insomnia, restlessness, decreased interest, sad and suicidal ideas.  Associated symptoms include no decreased concentration and no headaches.  Compliance with treatment is variable (Pt reports only taking venlafaxine 37.5mg  twice a day.).  Previous treatment provided no relief relief.  Risk factors include history of suicide attempt.     Patient Active Problem List   Diagnosis Date Noted  . Passive suicidal ideations 01/09/2020  . Mucopurulent chronic bronchitis (HCC) 01/09/2020  . Mixed hyperlipidemia 08/22/2019  . Acute left-sided low back pain with left-sided sciatica 08/22/2019  . Discharge from left nipple 06/15/2019  . Bilateral leg cramps 05/25/2019  . Plantar fasciitis 05/25/2019  .  Radicular pain in right arm 05/25/2019  . Suprapubic pain 05/25/2019  . Urinary urgency 05/25/2019  . Tobacco use disorder 05/25/2019  . Mild intermittent asthma without complication 05/25/2019  . History of suicide attempt 05/25/2019  . MDD (major depressive disorder) 05/25/2019  . Adenomyosis 12/01/2017  . Dysmenorrhea 11/09/2017  . Galactorrhea 11/09/2017  . Chronic pelvic pain in female 11/09/2017   Past Medical History:  Diagnosis Date  . Anxiety    NO MEDS  . Asthma    WELL CONTROLLED  . Depression    NO MEDS  . Dyspnea    PT STATES CHRONIC SOB (SMOKER) BUT STATES SHE CAN WALK A MILE WITHOUT GETTING SOB OR HAVING CHEST PAIN  . Endometriosis   . Family history of adverse reaction to anesthesia    DAD-CAN HEAR SURGEONS TALKING DURING SURGERY BUT NEVER FEELS ANYTHING  . Seizures (HCC) 2016   X2 -NO MEDS   Social History   Tobacco Use  . Smoking status: Current Every Day Smoker    Packs/day: 1.00    Years: 23.00    Pack years: 23.00    Types: Cigarettes  . Smokeless tobacco: Never Used  Substance Use Topics  . Alcohol use: Yes    Alcohol/week: 2.0 standard drinks    Types: 2 Cans of beer per week    Comment: once weekly  . Drug use: No   No Known Allergies   Medications: Outpatient Medications Prior to Visit  Medication Sig  . albuterol (PROVENTIL HFA;VENTOLIN HFA) 108 (90 Base) MCG/ACT inhaler Inhale 2 puffs into the lungs every 4 (four) hours as needed for wheezing or shortness of breath.  . hydrOXYzine (ATARAX/VISTARIL) 10 MG tablet Take 1  tablet (10 mg total) by mouth 3 (three) times daily as needed.  . tiotropium (SPIRIVA HANDIHALER) 18 MCG inhalation capsule Place 1 capsule (18 mcg total) into inhaler and inhale daily.  . [DISCONTINUED] venlafaxine XR (EFFEXOR-XR) 150 MG 24 hr capsule Take 1 capsule (150 mg total) by mouth daily with breakfast.   No facility-administered medications prior to visit.    Review of Systems  Constitutional: Negative for  malaise/fatigue.  HENT: Negative for postnasal drip and sore throat.   Respiratory: Positive for cough, shortness of breath and wheezing. Negative for apnea, choking, chest tightness and stridor.   Cardiovascular: Positive for dyspnea on exertion.  Gastrointestinal: Positive for heartburn.  Neurological: Negative for dizziness, light-headedness and headaches.  Psychiatric/Behavioral: Positive for depression, dysphoric mood, sleep disturbance and suicidal ideas. Negative for agitation, behavioral problems, confusion, decreased concentration and self-injury. The patient is nervous/anxious and has insomnia.     Last CBC Lab Results  Component Value Date   WBC 11.3 (H) 02/12/2020   HGB 13.1 02/12/2020   HCT 38.3 02/12/2020   MCV 96.2 02/12/2020   MCH 32.9 02/12/2020   RDW 12.8 02/12/2020   PLT 309 09/98/3382   Last metabolic panel Lab Results  Component Value Date   GLUCOSE 103 (H) 02/12/2020   NA 136 02/12/2020   K 3.7 02/12/2020   CL 106 02/12/2020   CO2 25 02/12/2020   BUN 6 02/12/2020   CREATININE 0.72 02/12/2020   GFRNONAA >60 02/12/2020   GFRAA >60 02/12/2020   CALCIUM 9.0 02/12/2020   PROT 7.6 12/09/2019   ALBUMIN 4.1 12/09/2019   LABGLOB 2.5 08/23/2019   AGRATIO 1.9 08/23/2019   BILITOT 0.5 12/09/2019   ALKPHOS 64 12/09/2019   AST 22 12/09/2019   ALT 22 12/09/2019   ANIONGAP 5 02/12/2020   Last lipids Lab Results  Component Value Date   CHOL 219 (H) 08/23/2019   HDL 57 08/23/2019   LDLCALC 143 (H) 08/23/2019   TRIG 109 08/23/2019   CHOLHDL 3.8 08/23/2019   Last hemoglobin A1c No results found for: HGBA1C Last thyroid functions Lab Results  Component Value Date   TSH 0.553 05/23/2019    Objective    BP 104/72 (BP Location: Left Arm, Patient Position: Sitting, Cuff Size: Normal)   Pulse 87   Temp (!) 96.9 F (36.1 C) (Temporal)   Wt 163 lb (73.9 kg)   LMP 11/29/2017 (Exact Date)   SpO2 99%   BMI 26.31 kg/m   Physical Exam Constitutional:       Appearance: Normal appearance.  Eyes:     Conjunctiva/sclera: Conjunctivae normal.  Cardiovascular:     Rate and Rhythm: Normal rate and regular rhythm.     Pulses: Normal pulses.     Heart sounds: Normal heart sounds.  Pulmonary:     Effort: Pulmonary effort is normal.     Breath sounds: Examination of the right-upper field reveals wheezing. Examination of the left-upper field reveals wheezing. Examination of the right-middle field reveals wheezing. Examination of the left-middle field reveals wheezing. Examination of the right-lower field reveals wheezing. Examination of the left-lower field reveals wheezing. Wheezing present.  Skin:    General: Skin is warm and dry.  Neurological:     Mental Status: She is alert and oriented to person, place, and time. Mental status is at baseline.  Psychiatric:        Mood and Affect: Mood normal.        Behavior: Behavior normal.  Thought Content: Thought content normal.        Judgment: Judgment normal.     No results found for any visits on 03/10/20.  Assessment & Plan     Problem List Items Addressed This Visit      Respiratory   Mucopurulent chronic bronchitis (HCC)    Uncontrolled Significant wheezing Needing albuterol multiple times per day Pt reports no improvement with Spiriva.  Will add Advair 250/7mcg to current treatment.  Advised on smoking cessation, pt is not ready to quit yet.  Continue albuterol prn Recheck in three months.          Other   Tobacco use disorder    Discussed the importance of tobacco cessation.  Pt states she is not ready yet.         MDD (major depressive disorder) - Primary    Pt reports improvement with venlafaxine 150mg  a day.  She states her pharmacy refilled the 37.5mg  instead 150mg  so she has been taking two a day.  Pt's depression symptoms are worse with the decreased dose.  Pt missed her phone visit with psychiatry, advised her to reach out to them and reschedule.  Will restart  venlafaxine 150mg  a day.  Recheck in 3 months.  Contract for safety.      Relevant Medications   venlafaxine XR (EFFEXOR-XR) 150 MG 24 hr capsule       Return in about 3 months (around 06/10/2020) for Follow up on depression and COPD.      I, , MD, have reviewed all documentation for this visit. The documentation on 03/10/20 for the exam, diagnosis, procedures, and orders are all accurate and complete.   Jodette Wik, 06/12/2020, MD, MPH [facility]

## 2020-04-29 NOTE — Progress Notes (Deleted)
     Established patient visit   Patient: Melinda Olsen   DOB: Oct 14, 1976   44 y.o. Female  MRN: [id number] Visit Date: 04/30/2020  Today's healthcare provider: Trey Sailors, PA-C   No chief complaint on file.  Subjective    HPI  Patient presents today wanting to be checked for STIs.   {Show patient history (optional):23778::" "}   Medications: Outpatient Medications Prior to Visit  Medication Sig  . albuterol (PROVENTIL HFA;VENTOLIN HFA) 108 (90 Base) MCG/ACT inhaler Inhale 2 puffs into the lungs every 4 (four) hours as needed for wheezing or shortness of breath.  . Fluticasone-Salmeterol (ADVAIR DISKUS) 250-50 MCG/DOSE AEPB Inhale 1 puff into the lungs 2 (two) times daily.  . hydrOXYzine (ATARAX/VISTARIL) 10 MG tablet Take 1 tablet (10 mg total) by mouth 3 (three) times daily as needed.  . tiotropium (SPIRIVA HANDIHALER) 18 MCG inhalation capsule Place 1 capsule (18 mcg total) into inhaler and inhale daily.  Marland Kitchen venlafaxine XR (EFFEXOR-XR) 150 MG 24 hr capsule Take 1 capsule (150 mg total) by mouth daily with breakfast.   No facility-administered medications prior to visit.    Review of Systems  {Heme  Chem  Endocrine  Serology  Results Review (optional):23779::" "}  Objective    LMP 11/29/2017 (Exact Date)  {Show previous vital signs (optional):23777::" "}  Physical Exam  ***  No results found for any visits on 04/30/20.  Assessment & Plan     ***  No follow-ups on file.      {provider attestation***:1}   Maryella Shivers  [facility] [phone] (phone) [phone] (fax)  [facility]

## 2020-04-30 ENCOUNTER — Telehealth: Payer: Self-pay | Admitting: Physician Assistant

## 2020-04-30 ENCOUNTER — Ambulatory Visit: Payer: Medicaid Other | Admitting: Physician Assistant

## 2020-04-30 NOTE — Telephone Encounter (Signed)
No show warning letter sent.  

## 2020-06-11 ENCOUNTER — Other Ambulatory Visit: Payer: Self-pay

## 2020-06-11 ENCOUNTER — Encounter: Payer: Self-pay | Admitting: Family Medicine

## 2020-06-11 ENCOUNTER — Ambulatory Visit (INDEPENDENT_AMBULATORY_CARE_PROVIDER_SITE_OTHER): Payer: Medicaid Other | Admitting: Family Medicine

## 2020-06-11 VITALS — BP 106/75 | HR 105 | Temp 99.0°F | Resp 16 | Wt 152.6 lb

## 2020-06-11 DIAGNOSIS — J411 Mucopurulent chronic bronchitis: Secondary | ICD-10-CM | POA: Diagnosis not present

## 2020-06-11 DIAGNOSIS — F17200 Nicotine dependence, unspecified, uncomplicated: Secondary | ICD-10-CM | POA: Diagnosis not present

## 2020-06-11 DIAGNOSIS — R000 Tachycardia, unspecified: Secondary | ICD-10-CM

## 2020-06-11 DIAGNOSIS — F332 Major depressive disorder, recurrent severe without psychotic features: Secondary | ICD-10-CM

## 2020-06-11 DIAGNOSIS — F411 Generalized anxiety disorder: Secondary | ICD-10-CM

## 2020-06-11 MED ORDER — ALBUTEROL SULFATE HFA 108 (90 BASE) MCG/ACT IN AERS: 2.0000 | INHALATION_SPRAY | RESPIRATORY_TRACT | 3 refills | Status: DC | PRN

## 2020-06-11 MED ORDER — FLUTICASONE-SALMETEROL 250-50 MCG/DOSE IN AEPB
1.0000 | INHALATION_SPRAY | Freq: Two times a day (BID) | RESPIRATORY_TRACT | 5 refills | Status: DC
Start: 2020-06-11 — End: 2020-09-11

## 2020-06-11 MED ORDER — VENLAFAXINE HCL ER 75 MG PO CP24: 75.0000 mg | ORAL_CAPSULE | Freq: Every day | ORAL | 3 refills | Status: AC

## 2020-06-11 MED ORDER — SPIRIVA HANDIHALER 18 MCG IN CAPS: 18.0000 ug | ORAL_CAPSULE | Freq: Every day | RESPIRATORY_TRACT | 12 refills | Status: AC

## 2020-06-11 NOTE — Assessment & Plan Note (Addendum)
Uncontrolled Using albuterol twice a week Continue Advair and spiriva daily Patient is still not ready to quit smoking yet Consider referral to Pulm - patient declines at this time

## 2020-06-11 NOTE — Progress Notes (Signed)
Established patient visit   Patient: Melinda Olsen   DOB: 14-Mar-1976   44 y.o. Female  MRN: [id number] Visit Date: 06/11/2020  Today's healthcare provider: Shirlee Latch, MD   I,Sulibeya S Dimas,acting as a scribe for Shirlee Latch, MD.,have documented all relevant documentation on the behalf of Shirlee Latch, MD,as directed by  Shirlee Latch, MD while in the presence of Shirlee Latch, MD.  Chief Complaint  Patient presents with  . COPD  . Depression   Subjective    HPI  DepressionAnxiety, Follow-up  She  was last seen for this 3 months ago. Changes made at last visit include restart venlafaxine 150 mg daily.   She reports fair compliance with treatment. She is having side effects. sleepy  She reports poor tolerance of treatment. Current symptoms include: insomnia She feels she is Unchanged since last visit.  Reports using hydroxyzine 10 mg at least twice a week.  Depression screen [facility] 2/9 06/11/2020 03/10/2020 01/09/2020  Decreased Interest 0 2 2  Down, Depressed, Hopeless 0 3 3  PHQ - 2 Score 0 5 5  Altered sleeping 1 3 3   Tired, decreased energy 0 2 2  Change in appetite 0 2 2  Feeling bad or failure about yourself  0 3 3  Trouble concentrating 0 1 1  Moving slowly or fidgety/restless 0 2 2  Suicidal thoughts 0 3 3  PHQ-9 Score 1 21 21   Difficult doing work/chores Not difficult at all - Extremely dIfficult    GAD 7 : Generalized Anxiety Score 06/11/2020 03/10/2020 01/09/2020 12/12/2019  Nervous, Anxious, on Edge 0 3 3 3   Control/stop worrying 3 3 3 3   Worry too much - different things 3 3 3  0  Trouble relaxing 0 2 2 3   Restless 0 3 1 3   Easily annoyed or irritable 1 3 3 3   Afraid - awful might happen 0 2 1 3   Total GAD 7 Score 7 19 16 18   Anxiety Difficulty Very difficult Somewhat difficult Extremely difficult Extremely difficult   -----------------------------------------------------------------------------------------  COPD, Follow  up  She was last seen for this 3 months ago. Changes made include add Advair 20/71mcg.   She reports excellent compliance with treatment. She is not having side effects.  she uses rescue inhaler 2 per weeks. She IS experiencing dyspnea, wheezing, weight loss and colored sputum. She is NOT experiencing cough, fatigue, chills or fever. she reports breathing is Unchanged.  Pulmonary Functions Testing Results:  No results found for: FEV1, FVC, FEV1FVC, TLC  -----------------------------------------------------------------------------------------   Patient Active Problem List   Diagnosis Date Noted  . Tachycardia 06/12/2020  . GAD (generalized anxiety disorder) 06/12/2020  . Passive suicidal ideations 01/09/2020  . Mucopurulent chronic bronchitis (HCC) 01/09/2020  . Mixed hyperlipidemia 08/22/2019  . Acute left-sided low back pain with left-sided sciatica 08/22/2019  . Discharge from left nipple 06/15/2019  . Bilateral leg cramps 05/25/2019  . Plantar fasciitis 05/25/2019  . Radicular pain in right arm 05/25/2019  . Suprapubic pain 05/25/2019  . Urinary urgency 05/25/2019  . Tobacco use disorder 05/25/2019  . Mild intermittent asthma without complication 05/25/2019  . History of suicide attempt 05/25/2019  . MDD (major depressive disorder) 05/25/2019  . Adenomyosis 12/01/2017  . Dysmenorrhea 11/09/2017  . Galactorrhea 11/09/2017  . Chronic pelvic pain in female 11/09/2017   Social History   Tobacco Use  . Smoking status: Current Every Day Smoker    Packs/day: 1.00    Years: 23.00    Pack  years: 23.00    Types: Cigarettes  . Smokeless tobacco: Never Used  Vaping Use  . Vaping Use: Never used  Substance Use Topics  . Alcohol use: Yes    Alcohol/week: 2.0 standard drinks    Types: 2 Cans of beer per week    Comment: once weekly  . Drug use: No   No Known Allergies     Medications: Outpatient Medications Prior to Visit  Medication Sig  . hydrOXYzine  (ATARAX/VISTARIL) 10 MG tablet Take 1 tablet (10 mg total) by mouth 3 (three) times daily as needed.  . [DISCONTINUED] albuterol (PROVENTIL HFA;VENTOLIN HFA) 108 (90 Base) MCG/ACT inhaler Inhale 2 puffs into the lungs every 4 (four) hours as needed for wheezing or shortness of breath.  . [DISCONTINUED] Fluticasone-Salmeterol (ADVAIR DISKUS) 250-50 MCG/DOSE AEPB Inhale 1 puff into the lungs 2 (two) times daily.  . [DISCONTINUED] tiotropium (SPIRIVA HANDIHALER) 18 MCG inhalation capsule Place 1 capsule (18 mcg total) into inhaler and inhale daily.  . [DISCONTINUED] venlafaxine XR (EFFEXOR-XR) 150 MG 24 hr capsule Take 1 capsule (150 mg total) by mouth daily with breakfast. (Patient not taking: Reported on 06/11/2020)   No facility-administered medications prior to visit.    Review of Systems  Constitutional: Positive for appetite change. Negative for chills, fatigue and fever.  Respiratory: Positive for shortness of breath and wheezing. Negative for cough and chest tightness.   Cardiovascular: Negative for chest pain and palpitations.  Psychiatric/Behavioral: Positive for sleep disturbance. The patient is nervous/anxious.     Last CBC Lab Results  Component Value Date   WBC 11.3 (H) 02/12/2020   HGB 13.1 02/12/2020   HCT 38.3 02/12/2020   MCV 96.2 02/12/2020   MCH 32.9 02/12/2020   RDW 12.8 02/12/2020   PLT 309 02/12/2020      Objective    BP 106/75 (BP Location: Left Arm, Patient Position: Sitting, Cuff Size: Normal)   Pulse (!) 105   Temp 99 F (37.2 C) (Oral)   Resp 16   Wt 152 lb 9.6 oz (69.2 kg)   LMP 11/29/2017 (Exact Date)   SpO2 99%   BMI 24.63 kg/m  BP Readings from Last 3 Encounters:  06/11/20 106/75  03/10/20 104/72  02/12/20 121/83   Wt Readings from Last 3 Encounters:  06/11/20 152 lb 9.6 oz (69.2 kg)  03/10/20 163 lb (73.9 kg)  02/12/20 165 lb (74.8 kg)      Physical Exam Vitals reviewed.  Constitutional:      General: She is not in acute distress.     Appearance: Normal appearance. She is well-developed. She is not diaphoretic.  HENT:     Head: Normocephalic and atraumatic.  Eyes:     General: No scleral icterus.    Conjunctiva/sclera: Conjunctivae normal.  Neck:     Thyroid: No thyromegaly.  Cardiovascular:     Rate and Rhythm: Regular rhythm. Tachycardia present.     Pulses: Normal pulses.     Heart sounds: Normal heart sounds. No murmur heard.   Pulmonary:     Effort: Pulmonary effort is normal. No respiratory distress.     Breath sounds: Wheezing present. No rhonchi or rales.  Musculoskeletal:     Cervical back: Neck supple.     Right lower leg: No edema.     Left lower leg: No edema.  Lymphadenopathy:     Cervical: No cervical adenopathy.  Skin:    General: Skin is warm and dry.     Findings: No rash.  Neurological:  Mental Status: She is alert and oriented to person, place, and time. Mental status is at baseline.  Psychiatric:        Mood and Affect: Mood normal.        Behavior: Behavior normal.      No results found for any visits on 06/11/20.  Assessment & Plan     Problem List Items Addressed This Visit      Respiratory   Mucopurulent chronic bronchitis (HCC)    Uncontrolled Using albuterol twice a week Continue Advair and spiriva daily Patient is still not ready to quit smoking yet Consider referral to Pulm - patient declines at this time         Other   Tobacco use disorder    3-5 minute discussion regarding the harms of tobacco use, the benefits of cessation, and methods of cessation Discussed that there are medication options to help with cessation Patient is currently precontemplative  Will reassess at next visit       MDD (major depressive disorder) - Primary    Chronic and uncontrolled She stopped Effexor she believes she got side effects from higher dose We will resume at lower dose Contracted for safety-no SI/HI Encourage therapy At follow-up, repeat PHQ-9 and GAD-7       Relevant Medications   venlafaxine XR (EFFEXOR XR) 75 MG 24 hr capsule   Tachycardia    New problem Slightly tachycardic on exam today She has taken high levels of caffeine, which likely contributes No signs of arrhythmia on exam today Patient declines EKG Check labs for any underlying pathology If persists with decreased caffeine intake or she develops any palpitations or irregular heart rate, will refer to cardiology      Relevant Orders   CBC with Differential/Platelet   Comprehensive metabolic panel   TSH   GAD (generalized anxiety disorder)    Chronic and uncontrolled Stopped taking Effexor as she believes she got side effects at higher dose We will resume at lower dose as this was previously helping control anxiety and depression Contracted for safety-no SI/HI Encourage therapy At follow-up, repeat PHQ-9 and GAD-7      Relevant Medications   venlafaxine XR (EFFEXOR XR) 75 MG 24 hr capsule       Return in about 3 months (around 09/11/2020) for chronic disease f/u.      I, Shirlee Latch, MD, have reviewed all documentation for this visit. The documentation on 06/12/20 for the exam, diagnosis, procedures, and orders are all accurate and complete.   Geo Slone, Marzella Schlein, MD, MPH [facility]

## 2020-06-12 DIAGNOSIS — R000 Tachycardia, unspecified: Secondary | ICD-10-CM | POA: Diagnosis not present

## 2020-06-12 DIAGNOSIS — F411 Generalized anxiety disorder: Secondary | ICD-10-CM | POA: Insufficient documentation

## 2020-06-12 NOTE — Assessment & Plan Note (Signed)
3-5 minute discussion regarding the harms of tobacco use, the benefits of cessation, and methods of cessation Discussed that there are medication options to help with cessation Patient is currently precontemplative  Will reassess at next visit  

## 2020-06-12 NOTE — Assessment & Plan Note (Signed)
New problem Slightly tachycardic on exam today She has taken high levels of caffeine, which likely contributes No signs of arrhythmia on exam today Patient declines EKG Check labs for any underlying pathology If persists with decreased caffeine intake or she develops any palpitations or irregular heart rate, will refer to cardiology

## 2020-06-12 NOTE — Assessment & Plan Note (Signed)
Chronic and uncontrolled Stopped taking Effexor as she believes she got side effects at higher dose We will resume at lower dose as this was previously helping control anxiety and depression Contracted for safety-no SI/HI Encourage therapy At follow-up, repeat PHQ-9 and GAD-7

## 2020-06-12 NOTE — Assessment & Plan Note (Signed)
Chronic and uncontrolled She stopped Effexor she believes she got side effects from higher dose We will resume at lower dose Contracted for safety-no SI/HI Encourage therapy At follow-up, repeat PHQ-9 and GAD-7

## 2020-06-13 ENCOUNTER — Telehealth: Payer: Self-pay

## 2020-06-13 LAB — CBC WITH DIFFERENTIAL/PLATELET
Basophils Absolute: 0.1 10*3/uL (ref 0.0–0.2)
Basos: 1 %
EOS (ABSOLUTE): 0.2 10*3/uL (ref 0.0–0.4)
Eos: 2 %
Hematocrit: 41.9 % (ref 34.0–46.6)
Hemoglobin: 13.8 g/dL (ref 11.1–15.9)
Immature Grans (Abs): 0 10*3/uL (ref 0.0–0.1)
Immature Granulocytes: 0 %
Lymphocytes Absolute: 4.3 10*3/uL — ABNORMAL HIGH (ref 0.7–3.1)
Lymphs: 32 %
MCH: 32.3 pg (ref 26.6–33.0)
MCHC: 32.9 g/dL (ref 31.5–35.7)
MCV: 98 fL — ABNORMAL HIGH (ref 79–97)
Monocytes Absolute: 0.6 10*3/uL (ref 0.1–0.9)
Monocytes: 5 %
Neutrophils Absolute: 8 10*3/uL — ABNORMAL HIGH (ref 1.4–7.0)
Neutrophils: 60 %
Platelets: 270 10*3/uL (ref 150–450)
RBC: 4.27 x10E6/uL (ref 3.77–5.28)
RDW: 11.8 % (ref 11.7–15.4)
WBC: 13.4 10*3/uL — ABNORMAL HIGH (ref 3.4–10.8)

## 2020-06-13 LAB — COMPREHENSIVE METABOLIC PANEL
ALT: 12 IU/L (ref 0–32)
AST: 17 IU/L (ref 0–40)
Albumin/Globulin Ratio: 1.6 (ref 1.2–2.2)
Albumin: 3.6 g/dL — ABNORMAL LOW (ref 3.8–4.8)
Alkaline Phosphatase: 60 IU/L (ref 48–121)
BUN/Creatinine Ratio: 8 — ABNORMAL LOW (ref 9–23)
BUN: 6 mg/dL (ref 6–24)
Bilirubin Total: 0.3 mg/dL (ref 0.0–1.2)
CO2: 22 mmol/L (ref 20–29)
Calcium: 8.3 mg/dL — ABNORMAL LOW (ref 8.7–10.2)
Chloride: 107 mmol/L — ABNORMAL HIGH (ref 96–106)
Creatinine, Ser: 0.71 mg/dL (ref 0.57–1.00)
GFR calc Af Amer: 121 mL/min/{1.73_m2} (ref >59)
GFR calc non Af Amer: 105 mL/min/{1.73_m2} (ref >59)
Globulin, Total: 2.3 g/dL (ref 1.5–4.5)
Glucose: 75 mg/dL (ref 65–99)
Potassium: 3.5 mmol/L (ref 3.5–5.2)
Sodium: 142 mmol/L (ref 134–144)
Total Protein: 5.9 g/dL — ABNORMAL LOW (ref 6.0–8.5)

## 2020-06-13 LAB — TSH: TSH: 0.701 u[IU]/mL (ref 0.450–4.500)

## 2020-06-13 NOTE — Telephone Encounter (Signed)
-----   Message from Erasmo Downer, MD sent at 06/13/2020  8:17 AM EDT ----- Labs near baseline.  Calcium and protein low.  Needs to try to eat healthy diet.  Can start calcium supplement/multivitamin.

## 2020-06-13 NOTE — Telephone Encounter (Signed)
Written by Erasmo Downer, MD on 06/13/2020 8:17 AM EDT Seen by patient Nelma Rothman on 06/13/2020 8:20 AM

## 2020-09-11 ENCOUNTER — Ambulatory Visit: Payer: Self-pay | Admitting: Family Medicine

## 2020-09-11 ENCOUNTER — Other Ambulatory Visit: Payer: Self-pay | Admitting: Family Medicine

## 2020-09-11 MED ORDER — FLUTICASONE-SALMETEROL 250-50 MCG/DOSE IN AEPB: 1.0000 | INHALATION_SPRAY | Freq: Two times a day (BID) | RESPIRATORY_TRACT | 2 refills | Status: AC

## 2020-09-11 MED ORDER — ALBUTEROL SULFATE HFA 108 (90 BASE) MCG/ACT IN AERS: 2.0000 | INHALATION_SPRAY | RESPIRATORY_TRACT | 1 refills | Status: AC | PRN

## 2020-09-11 NOTE — Telephone Encounter (Signed)
Copied from CRM [id number]. Topic: Quick Communication - Rx Refill/Question >> Sep 11, 2020 11:30 AM Mcneil, Jannifer Rodney wrote: Medication: albuterol (VENTOLIN HFA) 108 (90 Base) MCG/ACT inhaler, Fluticasone-Salmeterol (ADVAIR DISKUS) 250-50 MCG/DOSE AEPB, and tiotropium (SPIRIVA HANDIHALER) 18 MCG inhalation capsule,  Has the patient contacted their pharmacy? no  Preferred Pharmacy (with phone number or street name): [facility] [address] [place]), [place] - [address] Phone: [phone]  Fax: [phone]  Agent: Please be advised that RX refills may take up to 3 business days. We ask that you follow-up with your pharmacy.

## 2020-11-11 IMAGING — MR MR BREAST BX W/ LOC DEV 1ST LEASION IMAGE BX SPEC MR GUIDE*R*
7 of 10 series · 31 of 48 positions shown · IV contrast (8ml gadavist)
Comparison: Previous exams.
COMPARISON: Previous exams.

Addendum:
CLINICAL DATA: 43-year-old female for tissue sampling of 2 areas of
non masslike enhancement within the central UPPER OUTER RIGHT
breast.

EXAM:
MRI GUIDED CORE NEEDLE BIOPSY OF THE RIGHT BREAST X 2
TECHNIQUE: Multiplanar, multisequence MR imaging of the RIGHT breast was
performed both before and after administration of intravenous
contrast.
CONTRAST:  8mL Gadavist

[Series 2: fiducial unilateral · sagittal · 2.0mm · 1.33mm/px · 3 of 52 slices shown]
[im 1/52]
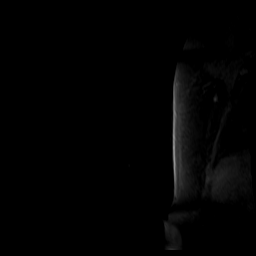
[im 26/52]
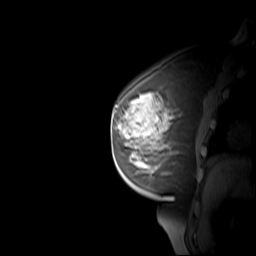
[im 52/52]
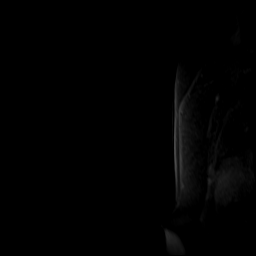

[Series 3: dynamic pre · axial · non-contrast · 1.3mm · 0.73mm/px · z∈[-78,+107]mm · 5 of 144 slices shown]
[im 1/144]
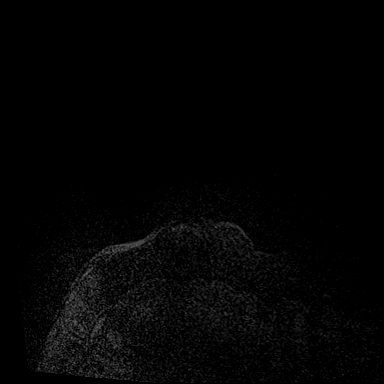
[im 36/144]
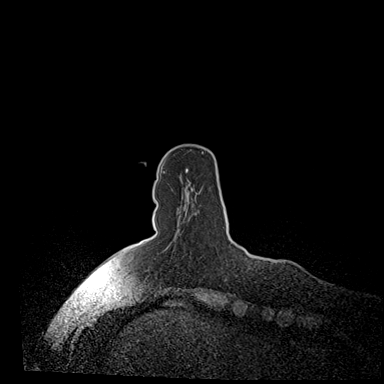
[im 72/144]
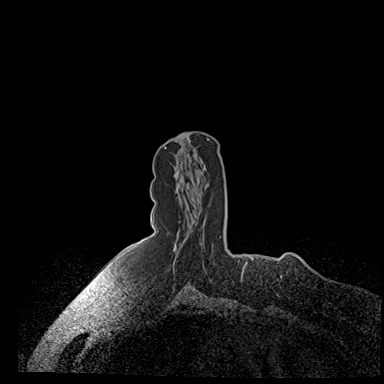
[im 108/144]
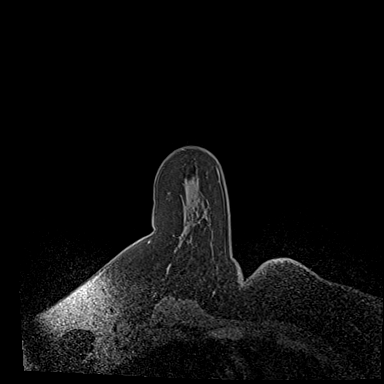
[im 144/144]
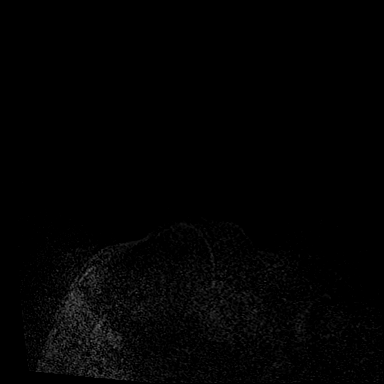

[Series 4: dynamic post 20 · axial · 1.3mm · 0.73mm/px · z∈[-78,+107]mm · 5 of 144 slices shown (1 of 2)]
[im 1/144]
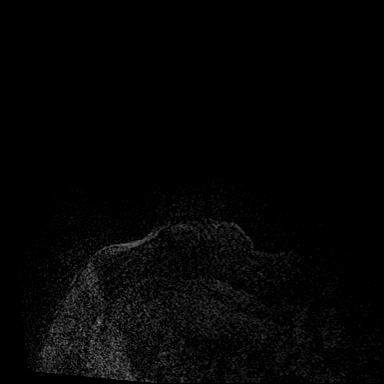
[im 36/144]
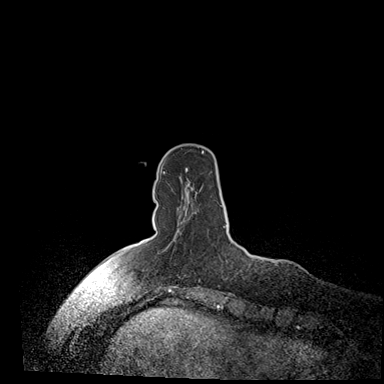
[im 72/144]
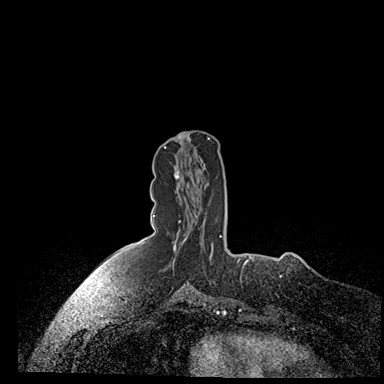
[im 108/144]
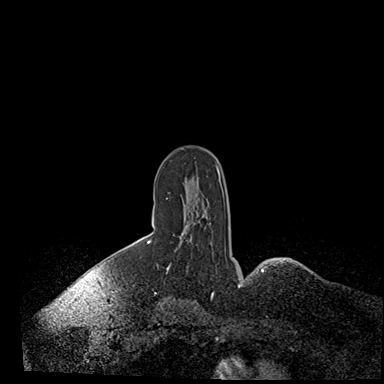
[im 144/144]
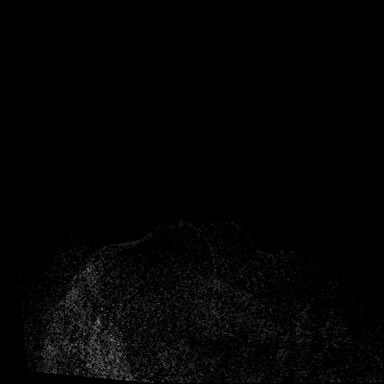

[Series 5: dynamic post 20 · axial · 1.3mm · 0.73mm/px · z∈[-78,+107]mm · 5 of 144 slices shown (2 of 2)]
[im 1/144]
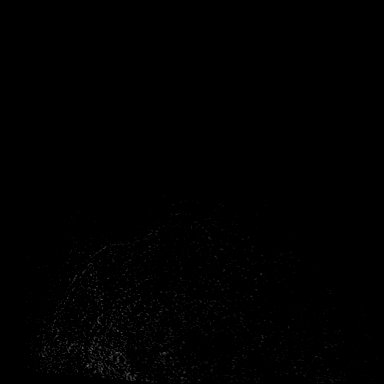
[im 36/144]
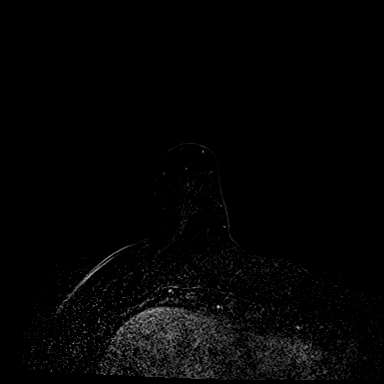
[im 72/144]
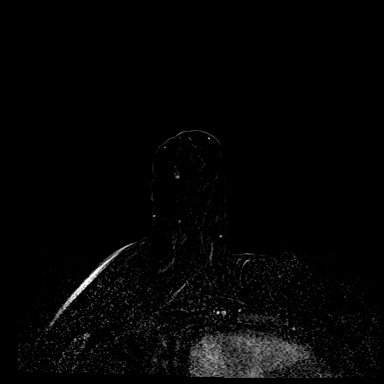
[im 108/144]
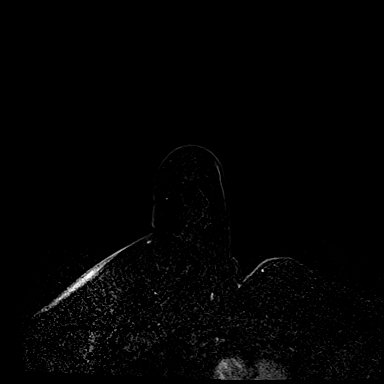
[im 144/144]
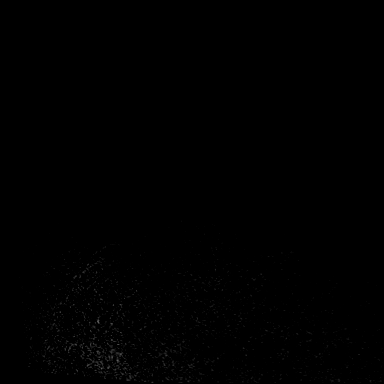

[Series 6: dynamic post 3 · axial · 1.3mm · 0.73mm/px · z∈[-78,+107]mm · 5 of 144 slices shown (1 of 2)]
[im 1/144]
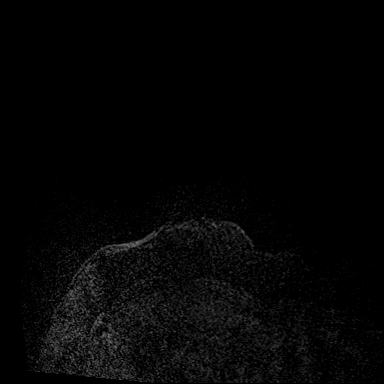
[im 36/144]
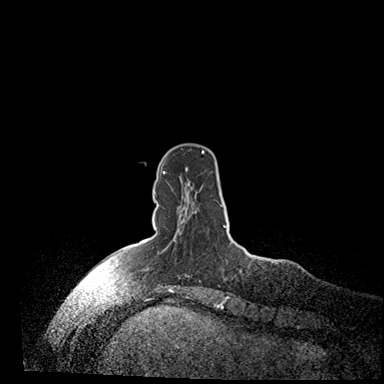
[im 72/144]
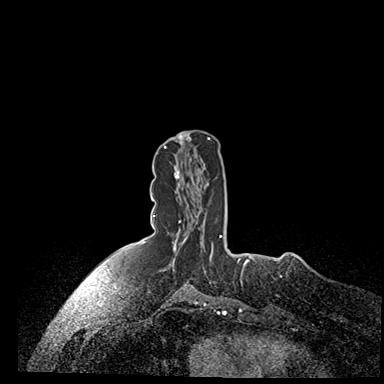
[im 108/144]
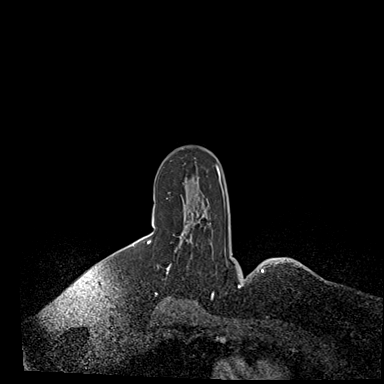
[im 144/144]
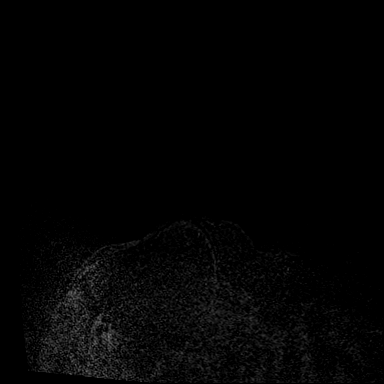

[Series 7: dynamic post 3 · axial · 1.3mm · 0.73mm/px · z∈[-78,+107]mm · 5 of 144 slices shown (2 of 2)]
[im 1/144]
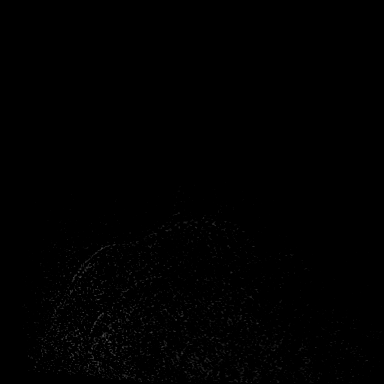
[im 36/144]
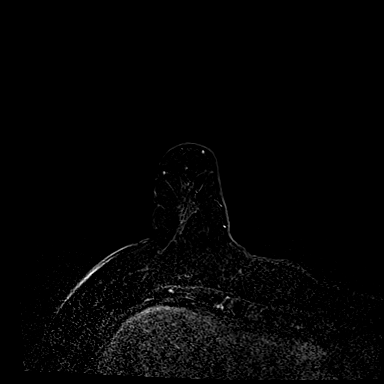
[im 72/144]
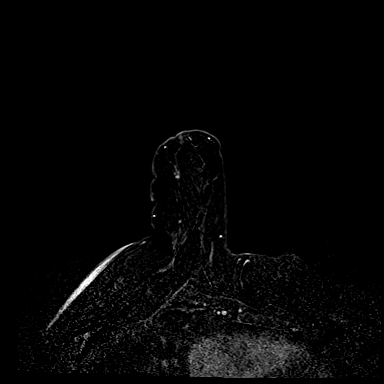
[im 108/144]
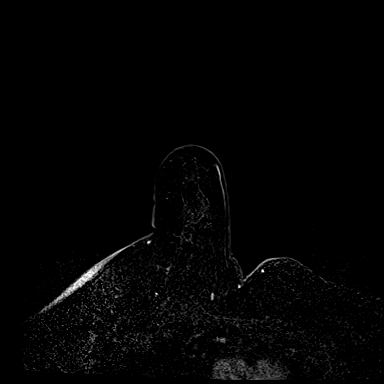
[im 144/144]
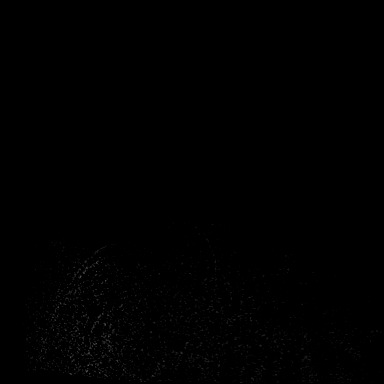

[Series 8: needle confirmation · axial · 1.3mm · 0.73mm/px · z∈[-78,+14]mm · 3 of 144 slices shown]
[im 1/144]
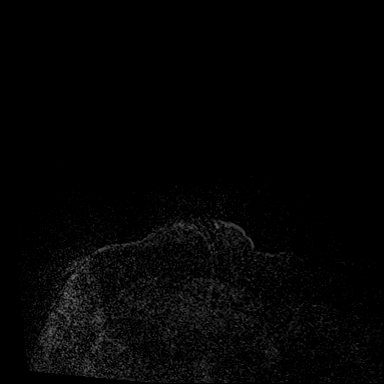
[im 36/144]
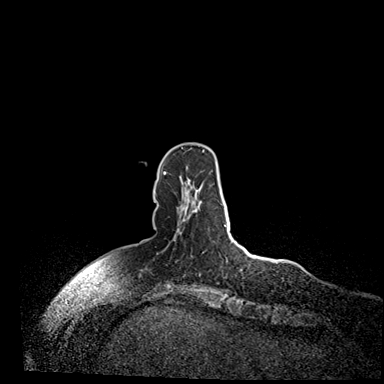
[im 72/144]
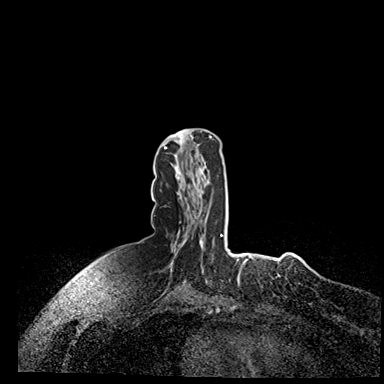

[31 of 48 positions shown; findings below may reference images not displayed]

FINDINGS: I met with the patient, and we discussed the procedure of MRI guided
biopsy, including risks, benefits, and alternatives. Specifically,
we discussed the risks of infection, bleeding, tissue injury, clip
migration, and inadequate sampling. Informed, written consent was
given. The usual time out protocol was performed immediately prior
to the procedure.

MR GUIDED CORE BIOPSY #1 (CYLINDER clip):

Using sterile technique, 1% Lidocaine, MRI guidance, and a 9 gauge
vacuum assisted device, biopsy was performed of non masslike
enhancement within the central UPPER OUTER RIGHT breast using a
LATERAL approach. At the conclusion of the procedure, a CYLINDER
tissue marker clip was deployed into the biopsy cavity. Follow-up
2-view mammogram was performed and dictated separately.

MR GUIDED BIOPSY #2 (BARBELL clip):

Using sterile technique, 1% Lidocaine, MRI guidance, and a 9 gauge
vacuum assisted device, biopsy was performed of non masslike
enhancement within the mid-posterior central RIGHT breast using a
LATERAL approach. At the conclusion of the procedure, a BARBELL
tissue marker clip was deployed into the biopsy cavity. Follow-up
2-view mammogram was performed and dictated separately.
IMPRESSION: MRI guided biopsy of 2 separate areas of non masslike enhancement
within the RIGHT breast. No apparent complications.

ADDENDUM:
Pathology revealed FIBROCYSTIC CHANGES of the RIGHT breast, central
upper outer. This was found to be concordant by Dr. Babloo Almeshal.

Pathology revealed FIBROCYSTIC CHANGES WITH ADENOSIS,
PSEUDOANGIOMATOUS STROMAL HYPERPLASIA (PASH) of the RIGHT breast,
mid-posterior central. This was found to be concordant by Dr. Sisterna
Feliks.

Pathology results were discussed with the patient by telephone. The
patient reported doing well after the biopsies with tenderness at
the sites. Post biopsy instructions and care were reviewed and
questions were answered. The patient was encouraged to call The

Recommend surgical consultation for further management/monitoring of
the LEFT nipple discharge and for follow-up of the small red papule
on the LEFT nipple per previous diagnostic report. Bilateral breast
MRI in 6 months per protocol. Patient is scheduled to see Dr. Clementina
Norman of [HOSPITAL] on July 25, 2019.

Pathology results reported by Nomasibulele Moatshe, RN on 07/20/2019.

*** End of Addendum ***
FINDINGS: I met with the patient, and we discussed the procedure of MRI guided
biopsy, including risks, benefits, and alternatives. Specifically,
we discussed the risks of infection, bleeding, tissue injury, clip
migration, and inadequate sampling. Informed, written consent was
given. The usual time out protocol was performed immediately prior
to the procedure.

MR GUIDED CORE BIOPSY #1 (CYLINDER clip):

Using sterile technique, 1% Lidocaine, MRI guidance, and a 9 gauge
vacuum assisted device, biopsy was performed of non masslike
enhancement within the central UPPER OUTER RIGHT breast using a
LATERAL approach. At the conclusion of the procedure, a CYLINDER
tissue marker clip was deployed into the biopsy cavity. Follow-up
2-view mammogram was performed and dictated separately.

MR GUIDED BIOPSY #2 (BARBELL clip):

Using sterile technique, 1% Lidocaine, MRI guidance, and a 9 gauge
vacuum assisted device, biopsy was performed of non masslike
enhancement within the mid-posterior central RIGHT breast using a
LATERAL approach. At the conclusion of the procedure, a BARBELL
tissue marker clip was deployed into the biopsy cavity. Follow-up
2-view mammogram was performed and dictated separately.
IMPRESSION: MRI guided biopsy of 2 separate areas of non masslike enhancement
within the RIGHT breast. No apparent complications.

## 2020-11-17 ENCOUNTER — Other Ambulatory Visit: Payer: Self-pay

## 2020-11-17 ENCOUNTER — Encounter: Payer: Self-pay | Admitting: Family Medicine

## 2020-11-17 DIAGNOSIS — Z20822 Contact with and (suspected) exposure to covid-19: Secondary | ICD-10-CM

## 2020-11-18 DIAGNOSIS — U071 COVID-19: Secondary | ICD-10-CM | POA: Diagnosis not present

## 2020-11-18 DIAGNOSIS — Z20822 Contact with and (suspected) exposure to covid-19: Secondary | ICD-10-CM | POA: Diagnosis not present

## 2020-11-20 DIAGNOSIS — U071 COVID-19: Secondary | ICD-10-CM | POA: Diagnosis not present

## 2020-11-20 DIAGNOSIS — Z20822 Contact with and (suspected) exposure to covid-19: Secondary | ICD-10-CM | POA: Diagnosis not present

## 2020-11-20 LAB — NOVEL CORONAVIRUS, NAA: SARS-CoV-2, NAA: DETECTED — AB

## 2020-11-21 ENCOUNTER — Telehealth: Payer: Self-pay

## 2020-11-21 NOTE — Telephone Encounter (Signed)
Called to discuss with patient about COVID-19 symptoms and the use of one of the available treatments for those with mild to moderate Covid symptoms and at a high risk of hospitalization.  Pt appears to qualify for outpatient treatment due to co-morbid conditions and/or a member of an at-risk group in accordance with the FDA Emergency Use Authorization.    Symptom onset: "Last week" Vaccinated: Unknown Booster? Unknown Immunocompromised? No Qualifiers: Yes  Unable to reach pt - Left message with call back number [phone].   Melinda Olsen

## 2020-11-26 DIAGNOSIS — J441 Chronic obstructive pulmonary disease with (acute) exacerbation: Secondary | ICD-10-CM | POA: Diagnosis not present

## 2020-11-26 DIAGNOSIS — U071 COVID-19: Secondary | ICD-10-CM | POA: Diagnosis not present

## 2020-11-26 DIAGNOSIS — R0602 Shortness of breath: Secondary | ICD-10-CM | POA: Diagnosis not present

## 2020-11-26 DIAGNOSIS — J45901 Unspecified asthma with (acute) exacerbation: Secondary | ICD-10-CM | POA: Diagnosis not present

## 2021-11-23 DIAGNOSIS — F3130 Bipolar disorder, current episode depressed, mild or moderate severity, unspecified: Secondary | ICD-10-CM | POA: Diagnosis not present

## 2021-12-01 DIAGNOSIS — F3130 Bipolar disorder, current episode depressed, mild or moderate severity, unspecified: Secondary | ICD-10-CM | POA: Diagnosis not present

## 2021-12-08 DIAGNOSIS — F3130 Bipolar disorder, current episode depressed, mild or moderate severity, unspecified: Secondary | ICD-10-CM | POA: Diagnosis not present

## 2021-12-24 DIAGNOSIS — F3130 Bipolar disorder, current episode depressed, mild or moderate severity, unspecified: Secondary | ICD-10-CM | POA: Diagnosis not present

## 2021-12-30 DIAGNOSIS — F3130 Bipolar disorder, current episode depressed, mild or moderate severity, unspecified: Secondary | ICD-10-CM | POA: Diagnosis not present

## 2022-01-02 DIAGNOSIS — F3130 Bipolar disorder, current episode depressed, mild or moderate severity, unspecified: Secondary | ICD-10-CM | POA: Diagnosis not present

## 2022-01-14 DIAGNOSIS — F3130 Bipolar disorder, current episode depressed, mild or moderate severity, unspecified: Secondary | ICD-10-CM | POA: Diagnosis not present

## 2022-01-15 DIAGNOSIS — F3130 Bipolar disorder, current episode depressed, mild or moderate severity, unspecified: Secondary | ICD-10-CM | POA: Diagnosis not present

## 2022-01-19 DIAGNOSIS — F3130 Bipolar disorder, current episode depressed, mild or moderate severity, unspecified: Secondary | ICD-10-CM | POA: Diagnosis not present

## 2022-01-20 DIAGNOSIS — F3130 Bipolar disorder, current episode depressed, mild or moderate severity, unspecified: Secondary | ICD-10-CM | POA: Diagnosis not present

## 2022-01-20 NOTE — Congregational Nurse Program (Signed)
?  Dept: [phone] ? ? ?Congregational Nurse Program Note ? ?Date of Encounter: 01/20/2022 ? ?Past Medical History: ?Past Medical History:  ?Diagnosis Date  ? Anxiety   ? NO MEDS  ? Asthma   ? WELL CONTROLLED  ? Depression   ? NO MEDS  ? Dyspnea   ? PT STATES CHRONIC SOB (SMOKER) BUT STATES SHE CAN WALK A MILE WITHOUT GETTING SOB OR HAVING CHEST PAIN  ? Endometriosis   ? Family history of adverse reaction to anesthesia   ? DAD-CAN HEAR SURGEONS TALKING DURING SURGERY BUT NEVER FEELS ANYTHING  ? Seizures (HCC) 2016  ? X2 -NO MEDS  ? ? ?Encounter Details: ? CNP Questionnaire - 01/20/22 1400   ? ?  ? Questionnaire  ? Do you give verbal consent to treat you today? Yes   ? Location Patient Served  Not Applicable   ? Visit Setting Church or Organization   ? Patient Status Unknown   ? Insurance Medicaid   ? Insurance Referral N/A   ? Medication Provided Medication Assistance   ? Medical Provider Yes   ? Screening Referrals N/A   ? Medical Referral N/A   ? Medical Appointment Made N/A   ? Food Have Food Insecurities   ? Transportation Need transportation assistance   ? Housing/Utilities N/A   ? Interpersonal Safety N/A   ? Intervention [facility]   ? ED Visit Averted N/A   ? Life-Saving Intervention Made N/A   ? ?  ?  ? ?  ? ?client into nurse only clinic for initial contact at food bank for assistance accessing Psych medications. Counseled re: confidentiality. Agrees to visit. Accompanied by Peer Support, Victorino Dike. States that had virtual Psych appt Feb 23rd and that he ordered 4 meds for her (generic Serequil, depakote, minipress and prozac) to treat her mental health diagnosis of bipolar with suicidal ideations. She had not filled RX to date b/c she was trying to gather money to pay for meds b/c she understood Medicaid wouldn't cover. When she went to have RX picked up, she was told [facility] pharmacy didn't have it available. Assisted client w/ call to [facility] pharmacy during visit. They stated that they  did not show the RX's in her [employer] profile. Clarified w/ [facility] Pharmacist re: limitations at their clinic to assist a person w/ M'caid as these drugs are typically covered w/ a $3 copay at other pharmacies. Client to follow up with Psych to clarify where RX was actually sent and to work with the pharmacy to see if any assistance can happen to access meds with limited money available for copays. Client verbalizes understanding and agreement. Due to diagnosis, clarified with client her emotional status today. Denies feelings of hurting self or others. Follow up in this clinic for assistance with screenings or resources as needed. Rhermann, RN ? ? ?

## 2022-01-22 DIAGNOSIS — F3130 Bipolar disorder, current episode depressed, mild or moderate severity, unspecified: Secondary | ICD-10-CM | POA: Diagnosis not present

## 2022-01-25 ENCOUNTER — Ambulatory Visit: Payer: Medicaid Other | Admitting: Family Medicine

## 2022-01-28 DIAGNOSIS — F3130 Bipolar disorder, current episode depressed, mild or moderate severity, unspecified: Secondary | ICD-10-CM | POA: Diagnosis not present

## 2022-01-29 DIAGNOSIS — F3130 Bipolar disorder, current episode depressed, mild or moderate severity, unspecified: Secondary | ICD-10-CM | POA: Diagnosis not present

## 2022-05-21 ENCOUNTER — Telehealth: Payer: Self-pay | Admitting: Family Medicine

## 2022-05-21 NOTE — Telephone Encounter (Signed)
..   Medicaid Managed Care   Unsuccessful Outreach Note  05/21/2022 Name: Melinda Olsen MRN: [id number] DOB: 22-Dec-1975  Referred by: Erasmo Downer, MD Reason for referral : High Risk Managed Medicaid (I called the patient today to get her scheduled with the MM Team. I left my name and number on her VM.)   An unsuccessful telephone outreach was attempted today. The patient was referred to the case management team for assistance with care management and care coordination.   Follow Up Plan: The care management team will reach out to the patient again over the next 14 days.    Weston Settle Care Guide, High Risk Medicaid Managed Care Embedded Care Coordination [facility]  [facility]    SIGNATURE

## 2022-06-14 DIAGNOSIS — F3130 Bipolar disorder, current episode depressed, mild or moderate severity, unspecified: Secondary | ICD-10-CM | POA: Diagnosis not present

## 2022-07-09 DIAGNOSIS — F3130 Bipolar disorder, current episode depressed, mild or moderate severity, unspecified: Secondary | ICD-10-CM | POA: Diagnosis not present

## 2022-07-13 DIAGNOSIS — F3130 Bipolar disorder, current episode depressed, mild or moderate severity, unspecified: Secondary | ICD-10-CM | POA: Diagnosis not present

## 2022-07-14 DIAGNOSIS — F3130 Bipolar disorder, current episode depressed, mild or moderate severity, unspecified: Secondary | ICD-10-CM | POA: Diagnosis not present

## 2022-07-16 DIAGNOSIS — F3130 Bipolar disorder, current episode depressed, mild or moderate severity, unspecified: Secondary | ICD-10-CM | POA: Diagnosis not present

## 2022-07-20 DIAGNOSIS — F3130 Bipolar disorder, current episode depressed, mild or moderate severity, unspecified: Secondary | ICD-10-CM | POA: Diagnosis not present

## 2022-07-23 DIAGNOSIS — F3130 Bipolar disorder, current episode depressed, mild or moderate severity, unspecified: Secondary | ICD-10-CM | POA: Diagnosis not present

## 2022-07-29 DIAGNOSIS — F3130 Bipolar disorder, current episode depressed, mild or moderate severity, unspecified: Secondary | ICD-10-CM | POA: Diagnosis not present

## 2022-07-30 DIAGNOSIS — F3130 Bipolar disorder, current episode depressed, mild or moderate severity, unspecified: Secondary | ICD-10-CM | POA: Diagnosis not present

## 2022-08-02 DIAGNOSIS — F3130 Bipolar disorder, current episode depressed, mild or moderate severity, unspecified: Secondary | ICD-10-CM | POA: Diagnosis not present

## 2022-08-04 DIAGNOSIS — F3130 Bipolar disorder, current episode depressed, mild or moderate severity, unspecified: Secondary | ICD-10-CM | POA: Diagnosis not present

## 2022-08-10 DIAGNOSIS — F3130 Bipolar disorder, current episode depressed, mild or moderate severity, unspecified: Secondary | ICD-10-CM | POA: Diagnosis not present

## 2022-08-12 DIAGNOSIS — F3130 Bipolar disorder, current episode depressed, mild or moderate severity, unspecified: Secondary | ICD-10-CM | POA: Diagnosis not present

## 2022-08-17 DIAGNOSIS — F3130 Bipolar disorder, current episode depressed, mild or moderate severity, unspecified: Secondary | ICD-10-CM | POA: Diagnosis not present

## 2022-08-19 DIAGNOSIS — F3130 Bipolar disorder, current episode depressed, mild or moderate severity, unspecified: Secondary | ICD-10-CM | POA: Diagnosis not present

## 2022-08-23 DIAGNOSIS — F3130 Bipolar disorder, current episode depressed, mild or moderate severity, unspecified: Secondary | ICD-10-CM | POA: Diagnosis not present

## 2022-08-26 DIAGNOSIS — F3130 Bipolar disorder, current episode depressed, mild or moderate severity, unspecified: Secondary | ICD-10-CM | POA: Diagnosis not present

## 2022-08-30 DIAGNOSIS — F3130 Bipolar disorder, current episode depressed, mild or moderate severity, unspecified: Secondary | ICD-10-CM | POA: Diagnosis not present

## 2022-09-01 DIAGNOSIS — F3130 Bipolar disorder, current episode depressed, mild or moderate severity, unspecified: Secondary | ICD-10-CM | POA: Diagnosis not present

## 2022-09-06 DIAGNOSIS — F3130 Bipolar disorder, current episode depressed, mild or moderate severity, unspecified: Secondary | ICD-10-CM | POA: Diagnosis not present

## 2022-09-13 ENCOUNTER — Emergency Department
Admission: EM | Admit: 2022-09-13 | Discharge: 2022-09-13 | Discharge status: Against Medical Advice | Payer: Medicaid Other | Attending: Emergency Medicine | Admitting: Emergency Medicine

## 2022-09-13 ENCOUNTER — Other Ambulatory Visit: Payer: Self-pay

## 2022-09-13 ENCOUNTER — Emergency Department: Payer: Medicaid Other

## 2022-09-13 DIAGNOSIS — Z1152 Encounter for screening for COVID-19: Secondary | ICD-10-CM | POA: Insufficient documentation

## 2022-09-13 DIAGNOSIS — Z87891 Personal history of nicotine dependence: Secondary | ICD-10-CM | POA: Insufficient documentation

## 2022-09-13 DIAGNOSIS — J449 Chronic obstructive pulmonary disease, unspecified: Secondary | ICD-10-CM | POA: Diagnosis not present

## 2022-09-13 DIAGNOSIS — R0600 Dyspnea, unspecified: Secondary | ICD-10-CM

## 2022-09-13 DIAGNOSIS — Z5329 Procedure and treatment not carried out because of patient's decision for other reasons: Secondary | ICD-10-CM | POA: Diagnosis not present

## 2022-09-13 DIAGNOSIS — R1013 Epigastric pain: Secondary | ICD-10-CM

## 2022-09-13 DIAGNOSIS — R0789 Other chest pain: Secondary | ICD-10-CM | POA: Diagnosis not present

## 2022-09-13 DIAGNOSIS — R0602 Shortness of breath: Secondary | ICD-10-CM | POA: Diagnosis not present

## 2022-09-13 DIAGNOSIS — R051 Acute cough: Secondary | ICD-10-CM

## 2022-09-13 LAB — COMPREHENSIVE METABOLIC PANEL
ALT: 9 U/L (ref 0–44)
AST: 11 U/L — ABNORMAL LOW (ref 15–41)
Albumin: 3.5 g/dL (ref 3.5–5.0)
Alkaline Phosphatase: 51 U/L (ref 38–126)
Anion gap: 6 (ref 5–15)
BUN: 11 mg/dL (ref 6–20)
CO2: 25 mmol/L (ref 22–32)
Calcium: 8.8 mg/dL — ABNORMAL LOW (ref 8.9–10.3)
Chloride: 107 mmol/L (ref 98–111)
Creatinine, Ser: 0.54 mg/dL (ref 0.44–1.00)
GFR, Estimated: >60 mL/min (ref >60)
Glucose, Bld: 100 mg/dL — ABNORMAL HIGH (ref 70–99)
Potassium: 4.1 mmol/L (ref 3.5–5.1)
Sodium: 138 mmol/L (ref 135–145)
Total Bilirubin: 0.6 mg/dL (ref 0.3–1.2)
Total Protein: 7.8 g/dL (ref 6.5–8.1)

## 2022-09-13 LAB — CBC WITH DIFFERENTIAL/PLATELET
Abs Immature Granulocytes: 0.04 10*3/uL (ref 0.00–0.07)
Basophils Absolute: 0 10*3/uL (ref 0.0–0.1)
Basophils Relative: 0 %
Eosinophils Absolute: 0.2 10*3/uL (ref 0.0–0.5)
Eosinophils Relative: 2 %
HCT: 34.7 % — ABNORMAL LOW (ref 36.0–46.0)
Hemoglobin: 11.3 g/dL — ABNORMAL LOW (ref 12.0–15.0)
Immature Granulocytes: 0 %
Lymphocytes Relative: 17 %
Lymphs Abs: 1.8 10*3/uL (ref 0.7–4.0)
MCH: 31.3 pg (ref 26.0–34.0)
MCHC: 32.6 g/dL (ref 30.0–36.0)
MCV: 96.1 fL (ref 80.0–100.0)
Monocytes Absolute: 0.8 10*3/uL (ref 0.1–1.0)
Monocytes Relative: 8 %
Neutro Abs: 7.6 10*3/uL (ref 1.7–7.7)
Neutrophils Relative %: 73 %
Platelets: 254 10*3/uL (ref 150–400)
RBC: 3.61 MIL/uL — ABNORMAL LOW (ref 3.87–5.11)
RDW: 13.8 % (ref 11.5–15.5)
WBC: 10.4 10*3/uL (ref 4.0–10.5)
nRBC: 0 % (ref 0.0–0.2)

## 2022-09-13 LAB — RESP PANEL BY RT-PCR (FLU A&B, COVID) ARPGX2
Influenza A by PCR: NEGATIVE
Influenza B by PCR: NEGATIVE
SARS Coronavirus 2 by RT PCR: NEGATIVE

## 2022-09-13 LAB — TROPONIN I (HIGH SENSITIVITY): Troponin I (High Sensitivity): 5 ng/L (ref <18)

## 2022-09-13 MED ORDER — FAMOTIDINE 20 MG PO TABS
20.0000 mg | ORAL_TABLET | Freq: Once | ORAL | Status: AC
  Administered 2022-09-13: 20 mg via ORAL
  Filled 2022-09-13: qty 1

## 2022-09-13 MED ORDER — ALUM & MAG HYDROXIDE-SIMETH 200-200-20 MG/5ML PO SUSP
30.0000 mL | Freq: Once | ORAL | Status: AC
  Administered 2022-09-13: 30 mL via ORAL
  Filled 2022-09-13: qty 30

## 2022-09-13 MED ORDER — LIDOCAINE VISCOUS HCL 2 % MT SOLN
15.0000 mL | Freq: Once | OROMUCOSAL | Status: AC
  Administered 2022-09-13: 15 mL via ORAL
  Filled 2022-09-13: qty 15

## 2022-09-13 MED ORDER — OMEPRAZOLE MAGNESIUM 20 MG PO TBEC: 20.0000 mg | DELAYED_RELEASE_TABLET | Freq: Every day | ORAL | 0 refills | Status: AC

## 2022-09-13 NOTE — Discharge Instructions (Addendum)
Take medications as prescribed. If you continue to have abdominal pain see your doctor to get a referral to the gastroenterologist. If you develop severe pain or worsening symptoms, bleeding in your stool or vomiting blood then call your doctor right away or come back to the emergency department.   Thank you for choosing [place] for your health care today!  Please see your primary doctor this week for a follow up appointment.   If you do not have a primary doctor call the following clinics to establish care:  If you have insurance:  [facility] [phone] [address] [place] [place] [address]   [facility]  [phone] [address] [place] [place] [address]   If you do not have insurance:  [facility]  [phone] [address], [place] [address]  Sometimes, in the early stages of certain disease courses it is difficult to detect in the emergency department evaluation -- so, it is important that you continue to monitor your symptoms and call your doctor right away or return to the emergency department if you develop any new or worsening symptoms.  It was my pleasure to care for you today.   Daneil Dan Modesto Charon, MD

## 2022-09-13 NOTE — ED Notes (Signed)
Pt here for fever for the past 3 days, not afebrile at this time. Pt also endorses some SOB, pt states HX of COPD and asthma. Pt still smoking cigarettes at this time.

## 2022-09-13 NOTE — ED Notes (Signed)
X-ray at bedside

## 2022-09-13 NOTE — ED Provider Notes (Signed)
[facility] Provider Note    Event Date/Time   First MD Initiated Contact with Patient 09/13/22 1024     (approximate)   History   No chief complaint on file.   HPI  Melinda Olsen is a 46 y.o. female   Past medical history ofCOPD, smoker, anxiety and depression, who presents with multiple complaints.  She states that she has had a subjective fever for the past 3 days that got better yesterday and no more fever today. She states that she has been constipated for the last 3 days and took a laxative yesterday and had a normal bowel movement without blood, has a history of constipation relieved by laxatives. She has left-sided upper abdominal discomfort without nausea or vomiting. She has had shortness of breath for the last few days and a mild cough without increase in sputum. SHe has left-sided chest pain that is not radiate, is worse with exertion.  Overall, the above symptoms have mostly improved or resolved except for some residual left-sided chest pain, mild cough, and epigastric/left upper quadrant discomfort.  History was obtained via the patient. I reviewed an external medical note including emergency department visit in April 2021 when she presented to the emergency department with shortness of breath and cough with atypical chest pain with a negative work-up and diagnosed with bronchospasm, negative troponin.     Physical Exam   Triage Vital Signs: ED Triage Vitals  Enc Vitals Group     BP 09/13/22 0942 114/75     Pulse Rate 09/13/22 0942 95     Resp 09/13/22 0942 18     Temp 09/13/22 0942 98.4 F (36.9 C)     Temp Source 09/13/22 0942 Oral     SpO2 09/13/22 0942 99 %     Weight --      Height --      Head Circumference --      Peak Flow --      Pain Score 09/13/22 1021 4     Pain Loc --      Pain Edu? --      Excl. in [place]? --     Most recent vital signs: Vitals:   09/13/22 0942  BP: 114/75  Pulse: 95  Resp: 18  Temp: 98.4  F (36.9 C)  SpO2: 99%    General: Awake, no distress.  Peers comfortable at rest, nontoxic, pleasant and conversant.  Hemodynamics reviewed and within normal limits, normotensive, afebrile, respirations unlabored, 99% on room air. CV:  Good peripheral perfusion.  Resp:  Normal effort.  Clear to auscultation without wheezing or focality.  Comfortable. Abd:  No distention.  No rigidity or guarding.  She states that there is some mild pain in the epigastrium/left upper quadrant    ED Results / Procedures / Treatments   Labs (all labs ordered are listed, but only abnormal results are displayed) Labs Reviewed  CBC WITH DIFFERENTIAL/PLATELET - Abnormal; Notable for the following components:      Result Value   RBC 3.61 (*)    Hemoglobin 11.3 (*)    HCT 34.7 (*)    All other components within normal limits  COMPREHENSIVE METABOLIC PANEL - Abnormal; Notable for the following components:   Glucose, Bld 100 (*)    Calcium 8.8 (*)    AST 11 (*)    All other components within normal limits  RESP PANEL BY RT-PCR (FLU A&B, COVID) ARPGX2  TROPONIN I (HIGH SENSITIVITY)  TROPONIN I (HIGH  SENSITIVITY)     I reviewed labs and they are notable for globin of 11.3, last was 13 over 2 years ago.  No elevation in white blood cell count.  EKG  ED ECG REPORT I, Pilar Jarvis, the attending physician, personally viewed and interpreted this ECG.   Date: 09/13/2022  EKG Time: 0950  Rate: 93  Rhythm: normal sinus rhythm  Axis: nl  Intervals:RBBB (old)   ST&T Change: TWI v1-3 appropriate iso RBBB; no other acute ischemic changes    RADIOLOGY I independently reviewed and interpreted chest x-ray and see no pneumothorax or focal consolidations.   PROCEDURES:  Critical Care performed: No  Procedures   MEDICATIONS ORDERED IN ED: Medications  alum & mag hydroxide-simeth (MAALOX/MYLANTA) 200-200-20 MG/5ML suspension 30 mL (30 mLs Oral Given 09/13/22 1113)    And  lidocaine (XYLOCAINE) 2 %  viscous mouth solution 15 mL (15 mLs Oral Given 09/13/22 1113)  famotidine (PEPCID) tablet 20 mg (20 mg Oral Given 09/13/22 1113)     IMPRESSION / MDM / ASSESSMENT AND PLAN / ED COURSE  I reviewed the triage vital signs and the nursing notes.                              Differential diagnosis includes, but is not limited to, respiratory infection, viral URI, bacterial pneumonia, ACS, PE, gastritis or GERD, intra-abdominal infection   The patient is on the cardiac monitor to evaluate for evidence of arrhythmia and/or significant heart rate changes.  MDM: Patient is very comfortable appearing with a benign exam, reports epigastric pain and some chest pain will evaluate for ACS with EKG and troponins.  Respiratory infection work-up given cough and reports of fever in the past several days, chest x-ray and basic labs as well as viral swabs.  Fortunately her abdominal exam is very benign soft without rigidity doubt intra-abdominal infection requiring surgery like obstruction, appendicitis, cholecystitis.  Hemodynamics are appropriate and reassuring.  I doubt PE.   As above as well as antacids for symptomatic relief.  I have low clinical suspicion for ACS, PE, surgical abdomen.   Patient eloped prior to completion of medical work-up  Patient's presentation is most consistent with acute presentation with potential threat to life or bodily function.       FINAL CLINICAL IMPRESSION(S) / ED DIAGNOSES   Final diagnoses:  Epigastric pain  Dyspnea, unspecified type  Acute cough  Chest pain, atypical     Rx / DC Orders   ED Discharge Orders          Ordered    omeprazole (PRILOSEC OTC) 20 MG tablet  Daily        09/13/22 1119             Note:  This document was prepared using Dragon voice recognition software and may include unintentional dictation errors.    Pilar Jarvis, MD 09/13/22 [phone]

## 2022-09-13 NOTE — ED Notes (Signed)
This RN was in critical pt's room. Upon exiting room pt is no longer in bed in hallway. Pt is not in bathroom or nearby vicinity. Pt is assumed to have eloped.

## 2022-09-20 ENCOUNTER — Telehealth: Payer: Self-pay | Admitting: Licensed Clinical Social Worker

## 2022-09-20 NOTE — Patient Outreach (Signed)
Transition Care Management Follow-up Telephone Call Date of discharge and from where: 09/13/22 from [facility] How have you been since you were released from the hospital? I am still in a lot of pain.  Any questions or concerns? No  Items Reviewed: Did the pt receive and understand the discharge instructions provided? Yes  Medications obtained and verified? No  Other? No  Any new allergies since your discharge? No  Dietary orders reviewed? No Do you have support at home? No   Home Care and Equipment/Supplies: Were home health services ordered? no If so, what is the name of the agency? na  Has the agency set up a time to come to the patient's home? NA Were any new equipment or medical supplies ordered?  No What is the name of the medical supply agency? NA Were you able to get the supplies/equipment? not applicable Do you have any questions related to the use of the equipment or supplies? NA  Functional Questionnaire: (I = Independent and D = Dependent) ADLs: I  Bathing/Dressing- I  Meal Prep- I  Eating- I  Maintaining continence- I  Transferring/Ambulation- I  Managing Meds- I  Follow up appointments reviewed:  PCP Hospital f/u appt confirmed? Yes  Scheduled to see pcp on 09/28/22 Specialist Hospital f/u appt confirmed? No   Are transportation arrangements needed? No  If their condition worsens, is the pt aware to call PCP or go to the Emergency Dept.? Yes Was the patient provided with contact information for the PCP's office or ED? Yes Was to pt encouraged to call back with questions or concerns? Yes  SDOH assessments and interventions completed:   Yes  Referral made to [facility] BSW for housing as patient is currently homeless.   Dickie La, BSW, MSW, [employer] Managed Medicaid LCSW [facility]  [facility] [email or web address] Phone: [phone]

## 2022-09-24 NOTE — Progress Notes (Deleted)
New patient visit   Patient: Melinda Olsen   DOB: 12/09/1975   46 y.o. Female  MRN: [id number] Visit Date: 09/28/2022  Today's healthcare provider: Jacky Kindle, FNP   No chief complaint on file.  Subjective    Melinda Olsen is a 46 y.o. female who presents today as a new patient to establish care.  HPI  ***  Past Medical History:  Diagnosis Date   Anxiety    NO MEDS   Asthma    WELL CONTROLLED   Depression    NO MEDS   Dyspnea    PT STATES CHRONIC SOB (SMOKER) BUT STATES SHE CAN WALK A MILE WITHOUT GETTING SOB OR HAVING CHEST PAIN   Endometriosis    Family history of adverse reaction to anesthesia    DAD-CAN HEAR SURGEONS TALKING DURING SURGERY BUT NEVER FEELS ANYTHING   Seizures (HCC) 2016   X2 -NO MEDS   Past Surgical History:  Procedure Laterality Date   BREAST DUCTAL SYSTEM EXCISION Left 08/01/2019   Procedure: EXCISION DUCTAL SYSTEM BREAST;  Surgeon: Henrene Dodge, MD;  Location: [facility] ORS;  Service: General;  Laterality: Left;   CESAREAN SECTION     X2   CYSTOSCOPY  12/22/2017   Procedure: CYSTOSCOPY;  Surgeon: Nadara Mustard, MD;  Location: [facility] ORS;  Service: Gynecology;;   LAPAROSCOPIC HYSTERECTOMY Bilateral 12/22/2017   Procedure: HYSTERECTOMY TOTAL LAPAROSCOPIC BILATERAL SALPINGECTOMY;  Surgeon: Nadara Mustard, MD;  Location: [facility] ORS;  Service: Gynecology;  Laterality: Bilateral;   TUBAL LIGATION     Family Status  Relation Name Status   Mother  (Not Specified)   Emelda Brothers  (Not Specified)   MGF  (Not Specified)   PGM  (Not Specified)   Emelda Brothers  (Not Specified)   Family History  Problem Relation Age of Onset   Throat cancer Mother    Diabetes Mother    Breast cancer Paternal Aunt    Cervical cancer Paternal Aunt    Diabetes Paternal Aunt    Lung cancer Maternal Grandfather    Heart attack Paternal Grandmother    Diabetes Paternal Grandmother    Lung cancer Paternal Aunt    Diabetes Paternal Aunt    Social History    Socioeconomic History   Marital status: Single    Spouse name: Not on file   Number of children: 2   Years of education: Not on file   Highest education level: Not on file  Occupational History   Occupation: cleaning buildings  Tobacco Use   Smoking status: Every Day    Packs/day: 1.00    Years: 23.00    Total pack years: 23.00    Types: Cigarettes   Smokeless tobacco: Never  Vaping Use   Vaping Use: Never used  Substance and Sexual Activity   Alcohol use: Yes    Alcohol/week: 2.0 standard drinks of alcohol    Types: 2 Cans of beer per week    Comment: once weekly   Drug use: No   Sexual activity: Not Currently    Birth control/protection: Surgical  Other Topics Concern   Not on file  Social History Narrative   Not on file   Social Determinants of Health   Financial Resource Strain: Not on file  Food Insecurity: Not on file  Transportation Needs: Not on file  Physical Activity: Not on file  Stress: Not on file  Social Connections: Not on file   Outpatient Medications Prior to Visit  Medication Sig  albuterol (VENTOLIN HFA) 108 (90 Base) MCG/ACT inhaler Inhale 2 puffs into the lungs every 4 (four) hours as needed for wheezing or shortness of breath.   Fluticasone-Salmeterol (ADVAIR DISKUS) 250-50 MCG/DOSE AEPB Inhale 1 puff into the lungs 2 (two) times daily.   hydrOXYzine (ATARAX/VISTARIL) 10 MG tablet Take 1 tablet (10 mg total) by mouth 3 (three) times daily as needed.   omeprazole (PRILOSEC OTC) 20 MG tablet Take 1 tablet (20 mg total) by mouth daily.   tiotropium (SPIRIVA HANDIHALER) 18 MCG inhalation capsule Place 1 capsule (18 mcg total) into inhaler and inhale daily.   venlafaxine XR (EFFEXOR XR) 75 MG 24 hr capsule Take 1 capsule (75 mg total) by mouth daily with breakfast.   No facility-administered medications prior to visit.   No Known Allergies   There is no immunization history on file for this patient.  Health Maintenance  Topic Date Due    COVID-19 Vaccine (1) Never done   HIV Screening  Never done   Hepatitis C Screening  Never done   DTaP/Tdap/Td (1 - Tdap) Never done   PAP SMEAR-Modifier  11/09/2020   INFLUENZA VACCINE  Never done   COLONOSCOPY (Pts 45-68yrs Insurance coverage will need to be confirmed)  Never done   HPV VACCINES  Aged Out    Patient Care Team: Bacigalupo, Dionne Bucy, MD as PCP - General (Family Medicine) Rico Junker, RN as Registered Nurse Theodore Demark, RN (Inactive) as Registered Nurse  Review of Systems  {Labs  Heme  Chem  Endocrine  Serology  Results Review (optional):23779}   Objective    LMP 11/27/2017 (Approximate)  {Show previous vital signs (optional):23777}  Physical Exam ***  Depression Screen    06/11/2020    3:58 PM 03/10/2020    8:55 AM 01/09/2020    3:18 PM 12/12/2019    8:18 AM  PHQ 2/9 Scores  PHQ - 2 Score 0 5 5 6   PHQ- 9 Score 1 21 21 21    No results found for any visits on 09/28/22.  Assessment & Plan     ***  No follow-ups on file.     {provider attestation***:1}   Gwyneth Sprout, [place] [phone] (phone) [phone] (fax)  [place]

## 2022-09-26 DIAGNOSIS — F3130 Bipolar disorder, current episode depressed, mild or moderate severity, unspecified: Secondary | ICD-10-CM | POA: Diagnosis not present

## 2022-09-28 ENCOUNTER — Ambulatory Visit: Payer: Medicaid Other | Admitting: Family Medicine

## 2022-09-28 ENCOUNTER — Ambulatory Visit (INDEPENDENT_AMBULATORY_CARE_PROVIDER_SITE_OTHER): Payer: Medicaid Other | Admitting: Family Medicine

## 2022-09-28 ENCOUNTER — Encounter: Payer: Self-pay | Admitting: Family Medicine

## 2022-09-28 VITALS — BP 114/80 | HR 120 | Temp 97.3°F | Resp 16 | Ht 65.0 in | Wt 154.0 lb

## 2022-09-28 DIAGNOSIS — R1012 Left upper quadrant pain: Secondary | ICD-10-CM

## 2022-09-28 DIAGNOSIS — F3130 Bipolar disorder, current episode depressed, mild or moderate severity, unspecified: Secondary | ICD-10-CM | POA: Diagnosis not present

## 2022-09-28 MED ORDER — HYDROXYZINE PAMOATE 25 MG PO CAPS: 25.0000 mg | ORAL_CAPSULE | Freq: Three times a day (TID) | ORAL | 0 refills | Status: AC | PRN

## 2022-09-28 NOTE — Progress Notes (Signed)
I,Melinda Olsen,acting as a scribe for [employer], MD.,have documented all relevant documentation on the behalf of Melinda Ramp, MD,as directed by  Melinda Ramp, MD while in the presence of Melinda Ramp, MD.   Established patient visit   Patient: Melinda Olsen   DOB: 1976-09-11   46 y.o. Female  MRN: [id number] Visit Date: 09/28/2022  Today's healthcare provider: Ronnald Ramp, MD   Chief Complaint  Patient presents with   breat lumps   Abdominal Pain   Subjective    HPI  LUQ Abdominal Pain   Patient here with c/o abdominal pain left side. Pain is on the LUQ, radiates to left side of chest. Pain is constant sharp pain and cramping. Patient denies diarrhea or loose stool. No blood in stool.Patient was seen at the ER on 11/20 for epigastric pain and was prescribed Prilosec. She reports sometimes it seems swollen/like there is a buldge in the area.  She reports that pain is tolerable but the more she walks or is active the pain increases. She states that she never started the prilosec because it did not help when she took it in the Ed. She states that she cannot walk more than 1.5 blocks to severe pain that increases. She reports that the pain is not relieved with resting or heating pad. She states that pain is level 5. Pain is at the top of the abdomen under the breast. She is accompanied by her fiance,Jason, who provides part of the history. She has tried Tylenol to NSAIDS with minimal pain. She has not vomited. She states that pain makes it difficult to eat due to pain. Last bowel movement was yesterday and appeared.   She also concern about lumps on both breast for the past 2 years. Reports that they are getting bigger and are sore to touch. No redness or discharge. Has a history of discharge from her left nipple reports that she had surgery on that side and had some biopsies done on the right breast three years  ago.  Patient would like her Hydroxyzine increased and reports that Effexor 75 mg is not helping and that she was having nightmares.  Medications: Outpatient Medications Prior to Visit  Medication Sig   albuterol (VENTOLIN HFA) 108 (90 Base) MCG/ACT inhaler Inhale 2 puffs into the lungs every 4 (four) hours as needed for wheezing or shortness of breath.   Fluticasone-Salmeterol (ADVAIR DISKUS) 250-50 MCG/DOSE AEPB Inhale 1 puff into the lungs 2 (two) times daily. (Patient not taking: Reported on 09/28/2022)   omeprazole (PRILOSEC OTC) 20 MG tablet Take 1 tablet (20 mg total) by mouth daily. (Patient not taking: Reported on 09/28/2022)   tiotropium (SPIRIVA HANDIHALER) 18 MCG inhalation capsule Place 1 capsule (18 mcg total) into inhaler and inhale daily. (Patient not taking: Reported on 09/28/2022)   venlafaxine XR (EFFEXOR XR) 75 MG 24 hr capsule Take 1 capsule (75 mg total) by mouth daily with breakfast. (Patient not taking: Reported on 09/28/2022)   [DISCONTINUED] hydrOXYzine (ATARAX/VISTARIL) 10 MG tablet Take 1 tablet (10 mg total) by mouth 3 (three) times daily as needed. (Patient not taking: Reported on 09/28/2022)   No facility-administered medications prior to visit.    Review of Systems     Objective    BP 114/80 (BP Location: Left Arm, Patient Position: Sitting, Cuff Size: Normal)   Pulse (!) 120   Temp (!) 97.3 F (36.3 C) (Oral)   Resp 16   Ht 5\' 5"  (1.651 m)  Wt 154 lb (69.9 kg)   LMP 11/27/2017 (Approximate)   SpO2 97%   BMI 25.63 kg/m    Physical Exam Constitutional:      Appearance: She is not toxic-appearing.     Comments: Patient observed making repeated abnormal movements with face and mouth Appears disheveled   Cardiovascular:     Heart sounds: Normal heart sounds.  Pulmonary:     Effort: Pulmonary effort is normal. No respiratory distress.     Breath sounds: Normal breath sounds. No stridor. No wheezing, rhonchi or rales.  Abdominal:     General: Bowel  sounds are normal.     Palpations: Abdomen is soft. There is no fluid wave, hepatomegaly, splenomegaly or mass.     Tenderness: There is no abdominal tenderness. There is no right CVA tenderness, left CVA tenderness, guarding or rebound. Negative signs include Murphy's sign, Rovsing's sign and McBurney's sign.     Hernia: No hernia is present.  Skin:    Coloration: Skin is not jaundiced.     Findings: No erythema or rash.  Neurological:     Mental Status: She is alert.       No results found for any visits on 09/28/22.   Assessment & Plan     Problem List Items Addressed This Visit       Other   LUQ abdominal pain - Primary    Differential remains broad includes constipation, trapped gas, hernia, splenic pain Given benign exam less of suspicion for peptic ulcer rupture, she denies frequent use of high risk medications that would be expected for this  She has not had vomiting and has been passing both flatus and stool to less suspicion for obstruction, I believe xray may help with identifying any possibility of PNA or pneumothorax that could be contributing to pain on that side  Pt does not have objective findings consistent with reported abdominal pain during exam Overall benign physical exam  Recommended CMP and U/A however patient states she recently void and is unable to give urine sample and is unable to complete blood draw today due to needed to have blood drawn for plasma and finances for food later this evening and giving additional blood specimen will make her weak  Discussed recommendation for CX given report of chest discomfort       Relevant Orders   Comprehensive metabolic panel   CBC w/Diff/Platelet   Lipase   DG Chest 2 View     Return in about 3 days (around 10/01/2022).      I, Melinda Ramp, MD, have reviewed all documentation for this visit.  Portions of this information were initially documented by the CMA and reviewed by me for thoroughness and  accuracy.   I spent 1 hour and 15 mins providing face to face care counseling, gathering history from the patient and her fiance, completing a physical exam and developing a treatment plan to accommodate challenges with social determinants of health with the patient and her family.    Melinda Ramp, MD  [facility] [phone] (phone) [phone] (fax)  [facility]

## 2022-09-29 ENCOUNTER — Other Ambulatory Visit: Payer: Medicaid Other

## 2022-09-29 NOTE — Patient Outreach (Signed)
  Medicaid Managed Care   Unsuccessful Outreach Note  09/29/2022 Name: Melinda Olsen MRN: [id number] DOB: 10/13/1976  Referred by: Bacigalupo, Angela M, MD Reason for referral : High Risk Managed Medicaid (MM Social work telephone outreach )   An unsuccessful telephone outreach was attempted today. The patient was referred to the case management team for assistance with care management and care coordination.   Follow Up Plan: The care management team will reach out to the patient again over the next 7 days.   Rebbie Lauricella, BSW, MHA [facility]  [place]  High Risk Managed Medicaid Team  [phone]  

## 2022-09-29 NOTE — Patient Instructions (Signed)
  Medicaid Managed Care   Unsuccessful Outreach Note  09/29/2022 Name: Melinda Olsen MRN: [id number] DOB: [birth year]  Referred by: Erasmo Downer, MD Reason for referral : High Risk Managed Medicaid (MM Social work telephone outreach )   An unsuccessful telephone outreach was attempted today. The patient was referred to the case management team for assistance with care management and care coordination.   Follow Up Plan: The care management team will reach out to the patient again over the next 7 days.   Gus Puma, BSW, [place] [facility]  [place]  High Risk Managed Medicaid Team  [phone]

## 2022-10-01 DIAGNOSIS — F3130 Bipolar disorder, current episode depressed, mild or moderate severity, unspecified: Secondary | ICD-10-CM | POA: Diagnosis not present

## 2022-10-03 DIAGNOSIS — R1012 Left upper quadrant pain: Secondary | ICD-10-CM | POA: Insufficient documentation

## 2022-10-03 NOTE — Assessment & Plan Note (Signed)
Differential remains broad includes constipation, trapped gas, hernia, splenic pain Given benign exam less of suspicion for peptic ulcer rupture, she denies frequent use of high risk medications that would be expected for this  She has not had vomiting and has been passing both flatus and stool to less suspicion for obstruction, I believe xray may help with identifying any possibility of PNA or pneumothorax that could be contributing to pain on that side  Pt does not have objective findings consistent with reported abdominal pain during exam Overall benign physical exam  Recommended CMP and U/A however patient states she recently void and is unable to give urine sample and is unable to complete blood draw today due to needed to have blood drawn for plasma and finances for food later this evening and giving additional blood specimen will make her weak  Discussed recommendation for CX given report of chest discomfort

## 2022-10-06 DIAGNOSIS — F3130 Bipolar disorder, current episode depressed, mild or moderate severity, unspecified: Secondary | ICD-10-CM | POA: Diagnosis not present

## 2022-10-07 NOTE — Progress Notes (Deleted)
      Established patient visit   Patient: Melinda Olsen   DOB: May 14, 1976   46 y.o. Female  MRN: [id number] Visit Date: 10/08/2022  Today's healthcare provider: Ronnald Ramp, MD   No chief complaint on file.  Subjective    HPI    Medications: Outpatient Medications Prior to Visit  Medication Sig   albuterol (VENTOLIN HFA) 108 (90 Base) MCG/ACT inhaler Inhale 2 puffs into the lungs every 4 (four) hours as needed for wheezing or shortness of breath.   Fluticasone-Salmeterol (ADVAIR DISKUS) 250-50 MCG/DOSE AEPB Inhale 1 puff into the lungs 2 (two) times daily. (Patient not taking: Reported on 09/28/2022)   hydrOXYzine (VISTARIL) 25 MG capsule Take 1 capsule (25 mg total) by mouth every 8 (eight) hours as needed.   omeprazole (PRILOSEC OTC) 20 MG tablet Take 1 tablet (20 mg total) by mouth daily. (Patient not taking: Reported on 09/28/2022)   tiotropium (SPIRIVA HANDIHALER) 18 MCG inhalation capsule Place 1 capsule (18 mcg total) into inhaler and inhale daily. (Patient not taking: Reported on 09/28/2022)   venlafaxine XR (EFFEXOR XR) 75 MG 24 hr capsule Take 1 capsule (75 mg total) by mouth daily with breakfast. (Patient not taking: Reported on 09/28/2022)   No facility-administered medications prior to visit.    Review of Systems  {Labs  Heme  Chem  Endocrine  Serology  Results Review (optional):23779}   Objective    LMP 11/27/2017 (Approximate)  {Show previous vital signs (optional):23777}  Physical Exam Vitals reviewed.  Constitutional:      General: She is not in acute distress.    Appearance: Normal appearance. She is not ill-appearing, toxic-appearing or diaphoretic.  Eyes:     Conjunctiva/sclera: Conjunctivae normal.  Cardiovascular:     Rate and Rhythm: Normal rate and regular rhythm.     Pulses: Normal pulses.     Heart sounds: Normal heart sounds. No murmur heard.    No friction rub. No gallop.  Pulmonary:     Effort: Pulmonary effort is  normal. No respiratory distress.     Breath sounds: Normal breath sounds. No stridor. No wheezing, rhonchi or rales.  Abdominal:     General: Bowel sounds are normal. There is no distension.     Palpations: Abdomen is soft.     Tenderness: There is no abdominal tenderness.  Musculoskeletal:     Right lower leg: No edema.     Left lower leg: No edema.  Skin:    Findings: No erythema or rash.  Neurological:     Mental Status: She is alert and oriented to person, place, and time.     ***  No results found for any visits on 10/08/22.  Assessment & Plan     Problem List Items Addressed This Visit   None    No follow-ups on file.      I, Ronnald Ramp, MD, have reviewed all documentation for this visit.  Portions of this information were initially documented by the CMA and reviewed by me for thoroughness and accuracy.      Ronnald Ramp, MD  [facility] [phone] (phone) [phone] (fax)  [facility]

## 2022-10-08 ENCOUNTER — Ambulatory Visit: Payer: Medicaid Other | Admitting: Family Medicine

## 2022-10-08 DIAGNOSIS — F3130 Bipolar disorder, current episode depressed, mild or moderate severity, unspecified: Secondary | ICD-10-CM | POA: Diagnosis not present

## 2022-10-11 ENCOUNTER — Telehealth: Payer: Self-pay

## 2022-10-11 NOTE — Telephone Encounter (Signed)
..   Medicaid Managed Care Note  10/11/2022 Name: Melinda Olsen MRN: [id number] DOB: 08-04-1976  Melinda Olsen is a 46 y.o. year old female who is a primary care patient of Beryle Flock, Marzella Schlein, MD and is actively engaged with the care management team. I reached out to Nelma Rothman by phone today to assist with re-scheduling an initial visit with the BSW. Patient was not available to speak.   Follow up plan: The care management team will reach out to the patient again over the next 7 days.   Weston Settle Care Guide, High Risk Medicaid Managed Care Embedded Care Coordination [facility]  [facility]

## 2022-10-20 ENCOUNTER — Telehealth: Payer: Self-pay

## 2022-10-20 NOTE — Telephone Encounter (Signed)
..   Medicaid Managed Care Note  10/20/2022 Name: Melinda Olsen MRN: [id number] DOB: 10/09/1976  Melinda Olsen is a 46 y.o. year old female who is a primary care patient of Beryle Flock, Marzella Schlein, MD and is actively engaged with the care management team. I reached out to Nelma Rothman by phone today to assist with re-scheduling an initial visit with the BSW. The patient's phone number was not in order. This was the third attempt to reach the patient.  Follow up plan: We have been unable to make contact with the patient for follow up. The care management team is available to follow up with the patient after provider conversation with the patient regarding recommendation for care management engagement and subsequent re-referral to the care management team.   Weston Settle Care Guide, High Risk Medicaid Managed Care Embedded Care Coordination [facility]  [facility]
